# Patient Record
Sex: Female | Born: 1980 | Race: Black or African American | Hispanic: No | Smoking: Never smoker
Health system: Southern US, Community
[De-identification: ages and names within clinical notes are randomized; demographics above are authoritative.]

## PROBLEM LIST (undated history)

## (undated) ENCOUNTER — Inpatient Hospital Stay (HOSPITAL_COMMUNITY): Payer: Self-pay

## (undated) DIAGNOSIS — I10 Essential (primary) hypertension: Secondary | ICD-10-CM

## (undated) DIAGNOSIS — Z789 Other specified health status: Secondary | ICD-10-CM

## (undated) HISTORY — PX: NO PAST SURGERIES: SHX2092

## (undated) HISTORY — DX: Essential (primary) hypertension: I10

## (undated) HISTORY — DX: Other specified health status: Z78.9

---

## 2006-08-14 ENCOUNTER — Emergency Department (HOSPITAL_COMMUNITY): Admission: EM | Admit: 2006-08-14 | Discharge: 2006-08-14 | Payer: Self-pay | Admitting: Family Medicine

## 2006-08-19 ENCOUNTER — Encounter: Admission: RE | Admit: 2006-08-19 | Discharge: 2006-08-19 | Payer: Self-pay | Admitting: Family Medicine

## 2007-04-21 ENCOUNTER — Emergency Department (HOSPITAL_COMMUNITY): Admission: EM | Admit: 2007-04-21 | Discharge: 2007-04-21 | Payer: Self-pay | Admitting: Family Medicine

## 2007-05-15 ENCOUNTER — Emergency Department (HOSPITAL_COMMUNITY): Admission: EM | Admit: 2007-05-15 | Discharge: 2007-05-15 | Payer: Self-pay | Admitting: Emergency Medicine

## 2008-04-12 ENCOUNTER — Emergency Department (HOSPITAL_COMMUNITY): Admission: EM | Admit: 2008-04-12 | Discharge: 2008-04-12 | Payer: Self-pay | Admitting: Emergency Medicine

## 2010-05-22 LAB — POCT PREGNANCY, URINE: Preg Test, Ur: NEGATIVE

## 2010-05-22 LAB — POCT I-STAT, CHEM 8
BUN: 12 mg/dL (ref 6–23)
Chloride: 104 mEq/L (ref 96–112)
Creatinine, Ser: 0.9 mg/dL (ref 0.4–1.2)
Potassium: 3.9 mEq/L (ref 3.5–5.1)
Sodium: 140 mEq/L (ref 135–145)
TCO2: 30 mmol/L (ref 0–100)

## 2010-05-22 LAB — URINALYSIS, ROUTINE W REFLEX MICROSCOPIC
Glucose, UA: NEGATIVE mg/dL
Hgb urine dipstick: NEGATIVE
Nitrite: NEGATIVE
Specific Gravity, Urine: 1.022 (ref 1.005–1.030)
pH: 7 (ref 5.0–8.0)

## 2010-05-22 LAB — DIFFERENTIAL
Eosinophils Relative: 1 % (ref 0–5)
Lymphocytes Relative: 36 % (ref 12–46)
Lymphs Abs: 2.5 10*3/uL (ref 0.7–4.0)
Neutro Abs: 3.8 10*3/uL (ref 1.7–7.7)

## 2010-05-22 LAB — CBC
HCT: 38.6 % (ref 36.0–46.0)
Platelets: 203 10*3/uL (ref 150–400)
WBC: 7 10*3/uL (ref 4.0–10.5)

## 2010-11-04 LAB — URINALYSIS, ROUTINE W REFLEX MICROSCOPIC
Bilirubin Urine: NEGATIVE
Hgb urine dipstick: NEGATIVE
Ketones, ur: NEGATIVE
Protein, ur: NEGATIVE
Urobilinogen, UA: 1

## 2010-11-04 LAB — WET PREP, GENITAL: Yeast Wet Prep HPF POC: NONE SEEN

## 2010-11-04 LAB — GC/CHLAMYDIA PROBE AMP, GENITAL
Chlamydia, DNA Probe: NEGATIVE
GC Probe Amp, Genital: NEGATIVE

## 2011-02-10 NOTE — L&D Delivery Note (Signed)
Delivery Note At 3:27 PM a viable female was delivered via Vaginal, Spontaneous Delivery (Presentation: Middle Occiput Anterior).     Placenta status: delivered with cord traction, intact.  Cord: 3 vessels with the following complications: None.    Anesthesia:  None Episiotomy: None Lacerations: None Suture Repair: n/a Est. Blood Loss (mL): 300 ml  Mom to postpartum.  Baby to nursery-stable.  JACKSON-MOORE,Charlann Wayne A 09/27/2011, 3:50 PM

## 2011-04-28 LAB — OB RESULTS CONSOLE RPR: RPR: NONREACTIVE

## 2011-04-28 LAB — OB RESULTS CONSOLE GC/CHLAMYDIA: Gonorrhea: NEGATIVE

## 2011-04-28 LAB — OB RESULTS CONSOLE ABO/RH

## 2011-04-28 LAB — OB RESULTS CONSOLE HIV ANTIBODY (ROUTINE TESTING): HIV: NONREACTIVE

## 2011-09-27 ENCOUNTER — Encounter (HOSPITAL_COMMUNITY): Payer: Self-pay | Admitting: *Deleted

## 2011-09-27 ENCOUNTER — Inpatient Hospital Stay (HOSPITAL_COMMUNITY)
Admission: AD | Admit: 2011-09-27 | Discharge: 2011-09-29 | DRG: 775 | Disposition: A | Discharge status: Home / Self Care | Payer: Medicaid Other | Source: Ambulatory Visit | Attending: Obstetrics & Gynecology | Admitting: Obstetrics & Gynecology

## 2011-09-27 DIAGNOSIS — O4290 Premature rupture of membranes, unspecified as to length of time between rupture and onset of labor, unspecified weeks of gestation: Principal | ICD-10-CM | POA: Diagnosis present

## 2011-09-27 HISTORY — DX: Other specified health status: Z78.9

## 2011-09-27 LAB — CBC
MCH: 28.3 pg (ref 26.0–34.0)
MCHC: 33.1 g/dL (ref 30.0–36.0)
MCV: 85.4 fL (ref 78.0–100.0)
Platelets: 209 10*3/uL (ref 150–400)
RBC: 4.24 MIL/uL (ref 3.87–5.11)

## 2011-09-27 LAB — ABO/RH: ABO/RH(D): A POS

## 2011-09-27 LAB — TYPE AND SCREEN

## 2011-09-27 MED ORDER — TERBUTALINE SULFATE 1 MG/ML IJ SOLN: 0.2500 mg | Freq: Once | INTRAMUSCULAR | Status: DC | PRN

## 2011-09-27 MED ORDER — DIBUCAINE 1 % RE OINT: 1.0000 "application " | TOPICAL_OINTMENT | RECTAL | Status: DC | PRN

## 2011-09-27 MED ORDER — BUTORPHANOL TARTRATE 2 MG/ML IJ SOLN
2.0000 mg | INTRAMUSCULAR | Status: DC | PRN
  Administered 2011-09-27: 2 mg via INTRAVENOUS

## 2011-09-27 MED ORDER — CITRIC ACID-SODIUM CITRATE 334-500 MG/5ML PO SOLN: 30.0000 mL | ORAL | Status: DC | PRN

## 2011-09-27 MED ORDER — OXYTOCIN BOLUS FROM INFUSION
250.0000 mL | Freq: Once | INTRAVENOUS | Status: DC
  Filled 2011-09-27: qty 500

## 2011-09-27 MED ORDER — ZOLPIDEM TARTRATE 5 MG PO TABS: 5.0000 mg | ORAL_TABLET | Freq: Every evening | ORAL | Status: DC | PRN

## 2011-09-27 MED ORDER — ONDANSETRON HCL 4 MG PO TABS: 4.0000 mg | ORAL_TABLET | ORAL | Status: DC | PRN

## 2011-09-27 MED ORDER — OXYTOCIN 40 UNITS IN LACTATED RINGERS INFUSION - SIMPLE MED: 62.5000 mL/h | Freq: Once | INTRAVENOUS | Status: DC

## 2011-09-27 MED ORDER — WITCH HAZEL-GLYCERIN EX PADS: 1.0000 "application " | MEDICATED_PAD | CUTANEOUS | Status: DC | PRN

## 2011-09-27 MED ORDER — LACTATED RINGERS IV SOLN
INTRAVENOUS | Status: DC
  Administered 2011-09-27: 125 mL/h via INTRAVENOUS

## 2011-09-27 MED ORDER — LIDOCAINE HCL (PF) 1 % IJ SOLN
30.0000 mL | INTRAMUSCULAR | Status: DC | PRN
  Filled 2011-09-27 (×2): qty 30

## 2011-09-27 MED ORDER — PENICILLIN G POTASSIUM 5000000 UNITS IJ SOLR
5.0000 10*6.[IU] | Freq: Once | INTRAMUSCULAR | Status: AC
  Administered 2011-09-27: 5 10*6.[IU] via INTRAVENOUS
  Filled 2011-09-27: qty 5

## 2011-09-27 MED ORDER — ACETAMINOPHEN 325 MG PO TABS: 650.0000 mg | ORAL_TABLET | ORAL | Status: DC | PRN

## 2011-09-27 MED ORDER — IBUPROFEN 600 MG PO TABS
600.0000 mg | ORAL_TABLET | Freq: Four times a day (QID) | ORAL | Status: DC
  Administered 2011-09-27 – 2011-09-29 (×7): 600 mg via ORAL
  Filled 2011-09-27 (×7): qty 1

## 2011-09-27 MED ORDER — MEDROXYPROGESTERONE ACETATE 150 MG/ML IM SUSP: 150.0000 mg | INTRAMUSCULAR | Status: DC | PRN

## 2011-09-27 MED ORDER — DIPHENHYDRAMINE HCL 25 MG PO CAPS: 25.0000 mg | ORAL_CAPSULE | Freq: Four times a day (QID) | ORAL | Status: DC | PRN

## 2011-09-27 MED ORDER — ONDANSETRON HCL 4 MG/2ML IJ SOLN: 4.0000 mg | Freq: Four times a day (QID) | INTRAMUSCULAR | Status: DC | PRN

## 2011-09-27 MED ORDER — PENICILLIN G POTASSIUM 5000000 UNITS IJ SOLR
2.5000 10*6.[IU] | INTRAVENOUS | Status: DC
  Administered 2011-09-27: 2.5 10*6.[IU] via INTRAVENOUS
  Filled 2011-09-27 (×5): qty 2.5

## 2011-09-27 MED ORDER — OXYCODONE-ACETAMINOPHEN 5-325 MG PO TABS: 1.0000 | ORAL_TABLET | ORAL | Status: DC | PRN

## 2011-09-27 MED ORDER — LANOLIN HYDROUS EX OINT: TOPICAL_OINTMENT | CUTANEOUS | Status: DC | PRN

## 2011-09-27 MED ORDER — FERROUS SULFATE 325 (65 FE) MG PO TABS
325.0000 mg | ORAL_TABLET | Freq: Two times a day (BID) | ORAL | Status: DC
  Administered 2011-09-28 – 2011-09-29 (×3): 325 mg via ORAL
  Filled 2011-09-27 (×4): qty 1

## 2011-09-27 MED ORDER — BUTORPHANOL TARTRATE 1 MG/ML IJ SOLN
2.0000 mg | INTRAMUSCULAR | Status: DC
  Filled 2011-09-27: qty 2

## 2011-09-27 MED ORDER — SENNOSIDES-DOCUSATE SODIUM 8.6-50 MG PO TABS
2.0000 | ORAL_TABLET | Freq: Every day | ORAL | Status: DC
  Administered 2011-09-27 – 2011-09-28 (×2): 2 via ORAL

## 2011-09-27 MED ORDER — OXYTOCIN 40 UNITS IN LACTATED RINGERS INFUSION - SIMPLE MED
1.0000 m[IU]/min | INTRAVENOUS | Status: DC
  Administered 2011-09-27: 2 m[IU]/min via INTRAVENOUS
  Filled 2011-09-27: qty 1000

## 2011-09-27 MED ORDER — PRENATAL MULTIVITAMIN CH
1.0000 | ORAL_TABLET | Freq: Every day | ORAL | Status: DC
  Administered 2011-09-28 – 2011-09-29 (×2): 1 via ORAL
  Filled 2011-09-27 (×2): qty 1

## 2011-09-27 MED ORDER — TETANUS-DIPHTH-ACELL PERTUSSIS 5-2.5-18.5 LF-MCG/0.5 IM SUSP
0.5000 mL | Freq: Once | INTRAMUSCULAR | Status: AC
  Administered 2011-09-28: 0.5 mL via INTRAMUSCULAR
  Filled 2011-09-27: qty 0.5

## 2011-09-27 MED ORDER — BENZOCAINE-MENTHOL 20-0.5 % EX AERO: 1.0000 "application " | INHALATION_SPRAY | CUTANEOUS | Status: DC | PRN

## 2011-09-27 MED ORDER — MEASLES, MUMPS & RUBELLA VAC ~~LOC~~ INJ
0.5000 mL | INJECTION | Freq: Once | SUBCUTANEOUS | Status: DC
  Filled 2011-09-27: qty 0.5

## 2011-09-27 MED ORDER — IBUPROFEN 600 MG PO TABS: 600.0000 mg | ORAL_TABLET | Freq: Four times a day (QID) | ORAL | Status: DC | PRN

## 2011-09-27 MED ORDER — MAGNESIUM HYDROXIDE 400 MG/5ML PO SUSP: 30.0000 mL | ORAL | Status: DC | PRN

## 2011-09-27 MED ORDER — FLEET ENEMA 7-19 GM/118ML RE ENEM: 1.0000 | ENEMA | RECTAL | Status: DC | PRN

## 2011-09-27 MED ORDER — LACTATED RINGERS IV SOLN: 500.0000 mL | INTRAVENOUS | Status: DC | PRN

## 2011-09-27 MED ORDER — ONDANSETRON HCL 4 MG/2ML IJ SOLN: 4.0000 mg | INTRAMUSCULAR | Status: DC | PRN

## 2011-09-27 NOTE — MAU Note (Signed)
Pt reports having clear blood tinged fluid leak out. Having mild ctx as well.

## 2011-09-27 NOTE — Progress Notes (Signed)
Pt standing at bedside 

## 2011-09-27 NOTE — Progress Notes (Signed)
Up to bathroom

## 2011-09-27 NOTE — H&P (Signed)
Mystie Ormand is a 31 y.o. female presenting for rupture of membranes. Maternal Medical History:  Reason for admission: Reason for admission: rupture of membranes.  Fetal activity: Perceived fetal activity is normal.    Prenatal complications: no prenatal complications   OB History    Grav Para Term Preterm Abortions TAB SAB Ect Mult Living   3 2 2       2      Past Medical History  Diagnosis Date  . No pertinent past medical history    Past Surgical History  Procedure Date  . No past surgeries    Family History: family history is not on file. Social History:  reports that she quit smoking about 7 years ago. She does not have any smokeless tobacco history on file. She reports that she does not drink alcohol or use illicit drugs.     Review of Systems  Constitutional: Negative for fever.  Eyes: Negative for blurred vision.  Respiratory: Negative for shortness of breath.   Gastrointestinal: Negative for vomiting.  Skin: Negative for rash.  Neurological: Negative for headaches.    Dilation: 2.5 Effacement (%): 60 Station: -2 Exam by:: K.Sipos,RN Blood pressure 124/74, pulse 85, temperature 97.9 F (36.6 C), temperature source Oral, resp. rate 16, height 5\' 6"  (1.676 m), weight 105.325 kg (232 lb 3.2 oz). Maternal Exam:  Introitus: not evaluated.   Cervix: Cervix evaluated by digital exam.     Fetal Exam Fetal Monitor Review: Variability: moderate (6-25 bpm).   Pattern: accelerations present and no decelerations.    Fetal State Assessment: Category I - tracings are normal.     Physical Exam  Constitutional: She appears well-developed.  HENT:  Head: Normocephalic.  Neck: Neck supple. No thyromegaly present.  Cardiovascular: Normal rate and regular rhythm.   Respiratory: Breath sounds normal.  GI: Soft. Bowel sounds are normal.  Skin: No rash noted.    Prenatal labs: ABO, Rh: A/Positive/-- (03/19 0000) Antibody: Negative (03/19 0000) Rubella: Immune  (03/19 0000) RPR: Nonreactive (03/19 0000)  HBsAg: Negative (03/19 0000)  HIV: Non-reactive (03/19 0000)    Assessment/Plan: Multipara @ [redacted]w[redacted]d.  PPROM.  Admit PCN GBS prophylaxis IOL with low dose Pitocin per protocol   JACKSON-MOORE,Dystany Duffy A 09/27/2011, 10:29 AM

## 2011-09-28 NOTE — Progress Notes (Signed)
Ur chart review completed.  

## 2011-09-28 NOTE — Progress Notes (Signed)
Post Partum Day 1 Subjective: no complaints  Objective: Blood pressure 109/69, pulse 82, temperature 98.4 F (36.9 C), temperature source Oral, resp. rate 18, height 5\' 6"  (1.676 m), weight 105.325 kg (232 lb 3.2 oz), unknown if currently breastfeeding.  Physical Exam:  General: alert and no distress Lochia: appropriate Uterine Fundus: firm Incision: none DVT Evaluation: No evidence of DVT seen on physical exam.   Basename 09/27/11 0845  HGB 12.0  HCT 36.2    Assessment/Plan: Plan for discharge tomorrow   LOS: 1 day   HARPER,CHARLES A 09/28/2011, 9:15 AM

## 2011-09-29 MED ORDER — OXYCODONE-ACETAMINOPHEN 5-325 MG PO TABS: 1.0000 | ORAL_TABLET | ORAL | Status: AC | PRN

## 2011-09-29 MED ORDER — IBUPROFEN 600 MG PO TABS: 600.0000 mg | ORAL_TABLET | Freq: Four times a day (QID) | ORAL | Status: DC

## 2011-09-29 NOTE — Discharge Summary (Signed)
Obstetric Discharge Summary Reason for Admission: rupture of membranes Prenatal Procedures: ultrasound Intrapartum Procedures: spontaneous vaginal delivery Postpartum Procedures: none Complications-Operative and Postpartum: none Hemoglobin  Date Value Range Status  09/27/2011 12.0  12.0 - 15.0 g/dL Final     HCT  Date Value Range Status  09/27/2011 36.2  36.0 - 46.0 % Final    Physical Exam:  General: alert and no distress Lochia: appropriate Uterine Fundus: firm Incision: none DVT Evaluation: No evidence of DVT seen on physical exam.  Discharge Diagnoses: PPROM.  IOL.  Discharge Information: Date: 09/29/2011 Activity: pelvic rest Diet: routine Medications: PNV, Ibuprofen, Colace and Percocet Condition: stable Instructions: refer to practice specific booklet Discharge to: home Follow-up Information    Follow up with HARPER,CHARLES A, MD. Schedule an appointment as soon as possible for a visit in 6 weeks.   Contact information:   [address] [place] [address] [phone]          Newborn Data: Live born female  Birth Weight: 5 lb 15.1 oz (2696 g) APGAR: 8, 9  Home with mother.  HARPER,CHARLES A 09/29/2011, 8:45 AM

## 2011-09-29 NOTE — Progress Notes (Signed)
Post Partum Day 2 Subjective: no complaints  Objective: Blood pressure 113/73, pulse 84, temperature 98 F (36.7 C), temperature source Oral, resp. rate 18, height 5\' 6"  (1.676 m), weight 105.325 kg (232 lb 3.2 oz), SpO2 98.00%, unknown if currently breastfeeding.  Physical Exam:  General: alert and no distress Lochia: appropriate Uterine Fundus: firm Incision: none DVT Evaluation: No evidence of DVT seen on physical exam.   Basename 09/27/11 0845  HGB 12.0  HCT 36.2    Assessment/Plan: Discharge home   LOS: 2 days   Karizma Cheek A 09/29/2011, 8:41 AM

## 2011-12-23 ENCOUNTER — Ambulatory Visit (HOSPITAL_COMMUNITY): Admission: RE | Admit: 2011-12-23 | Payer: Medicaid Other | Source: Ambulatory Visit

## 2013-03-16 ENCOUNTER — Ambulatory Visit: Payer: Self-pay | Admitting: Obstetrics

## 2013-12-06 ENCOUNTER — Ambulatory Visit: Payer: Medicaid Other | Admitting: Obstetrics

## 2013-12-11 ENCOUNTER — Encounter (HOSPITAL_COMMUNITY): Payer: Self-pay | Admitting: *Deleted

## 2013-12-26 ENCOUNTER — Ambulatory Visit: Payer: Medicaid Other | Admitting: Obstetrics

## 2013-12-27 ENCOUNTER — Ambulatory Visit: Payer: Medicaid Other | Admitting: Obstetrics

## 2014-01-01 ENCOUNTER — Encounter: Payer: Self-pay | Admitting: Obstetrics

## 2014-01-01 ENCOUNTER — Ambulatory Visit (INDEPENDENT_AMBULATORY_CARE_PROVIDER_SITE_OTHER): Payer: BC Managed Care – PPO | Admitting: Obstetrics

## 2014-01-01 VITALS — BP 137/84 | HR 65 | Temp 97.7°F | Ht 65.0 in | Wt 178.0 lb

## 2014-01-01 DIAGNOSIS — Z01419 Encounter for gynecological examination (general) (routine) without abnormal findings: Secondary | ICD-10-CM

## 2014-01-01 NOTE — Progress Notes (Signed)
Subjective:     Carla Morrow is a 33 y.o. female here for a routine exam.  Current complaints: none.    Personal health questionnaire:  Is patient Ashkenazi Jewish, have a family history of breast and/or ovarian cancer: no Is there a family history of uterine cancer diagnosed at age < 4050, gastrointestinal cancer, urinary tract cancer, family member who is a Personnel officerLynch syndrome-associated carrier: no Is the patient overweight and hypertensive, family history of diabetes, personal history of gestational diabetes or PCOS: no Is patient over 3355, have PCOS,  family history of premature CHD under age 31, diabetes, smoke, have hypertension or peripheral artery disease:  no At any time, has a partner hit, kicked or otherwise hurt or frightened you?: no Over the past 2 weeks, have you felt down, depressed or hopeless?: no Over the past 2 weeks, have you felt little interest or pleasure in doing things?:no   Gynecologic History Patient's last menstrual period was 12/15/2013. Contraception: none Last Pap: 2013. Results were: normal Last mammogram: n/a. Results were: n/a  Obstetric History OB History  Gravida Para Term Preterm AB SAB TAB Ectopic Multiple Living  3 3 2 1      3     # Outcome Date GA Lbr Len/2nd Weight Sex Delivery Anes PTL Lv  3 Preterm 09/27/11 3141w6d 08:26 / 00:11 5 lb 15.1 oz (2.696 kg) M Vag-Spont None  Y     Comments: WNL  2 Term           1 Term               Past Medical History  Diagnosis Date  . No pertinent past medical history     Past Surgical History  Procedure Laterality Date  . No past surgeries      No current outpatient prescriptions on file. No Known Allergies  History  Substance Use Topics  . Smoking status: Never Smoker   . Smokeless tobacco: Never Used  . Alcohol Use: No    Family History  Problem Relation Age of Onset  . Anuerysm Mother       Review of Systems  Constitutional: negative for fatigue and weight loss Respiratory: negative  for cough and wheezing Cardiovascular: negative for chest pain, fatigue and palpitations Gastrointestinal: negative for abdominal pain and change in bowel habits Musculoskeletal:negative for myalgias Neurological: negative for gait problems and tremors Behavioral/Psych: negative for abusive relationship, depression Endocrine: negative for temperature intolerance   Genitourinary:negative for abnormal menstrual periods, genital lesions, hot flashes, sexual problems and vaginal discharge Integument/breast: negative for breast lump, breast tenderness, nipple discharge and skin lesion(s)    Objective:       BP 137/84 mmHg  Pulse 65  Temp(Src) 97.7 F (36.5 C)  Ht 5\' 5"  (1.651 m)  Wt 178 lb (80.74 kg)  BMI 29.62 kg/m2  LMP 12/15/2013 General:   alert  Skin:   no rash or abnormalities  Lungs:   clear to auscultation bilaterally  Heart:   regular rate and rhythm, S1, S2 normal, no murmur, click, rub or gallop  Breasts:   normal without suspicious masses, skin or nipple changes or axillary nodes  Abdomen:  normal findings: no organomegaly, soft, non-tender and no hernia  Pelvis:  External genitalia: normal general appearance Urinary system: urethral meatus normal and bladder without fullness, nontender Vaginal: normal without tenderness, induration or masses Cervix: normal appearance Adnexa: normal bimanual exam Uterus: anteverted and non-tender, normal size   Lab Review Urine pregnancy test Labs reviewed  yes Radiologic studies reviewed no    Assessment:    Healthy female exam.    Contraceptive Counseling.  Wants Mirena IUD   Plan:   Mirena IUD ordered.  To be inserted with next menses.   Education reviewed: calcium supplements, low fat, low cholesterol diet, safe sex/STD prevention, self breast exams and weight bearing exercise. Contraception: IUD. Follow up in: 1 year.   No orders of the defined types were placed in this encounter.   Orders Placed This Encounter   Procedures  . WET PREP BY MOLECULAR PROBE  . GC/Chlamydia Probe Amp

## 2014-01-02 LAB — WET PREP BY MOLECULAR PROBE
Candida species: NEGATIVE
Gardnerella vaginalis: NEGATIVE
TRICHOMONAS VAG: NEGATIVE

## 2014-01-02 LAB — GC/CHLAMYDIA PROBE AMP
CT Probe RNA: NEGATIVE
GC PROBE AMP APTIMA: NEGATIVE

## 2014-01-03 LAB — PAP IG AND HPV HIGH-RISK: HPV DNA HIGH RISK: NOT DETECTED

## 2014-01-19 ENCOUNTER — Telehealth: Payer: Self-pay | Admitting: *Deleted

## 2014-01-19 NOTE — Telephone Encounter (Signed)
Patient called regarding a Mirena IUD Insertion.  Attempted to contact the patient and left message for her to contact the office.

## 2014-01-24 NOTE — Telephone Encounter (Signed)
Patient will be in the office tomorrow for a consult for a colpo. Will schedule with patient before she leaves. Patient advised and verbalized understanding.

## 2014-01-25 ENCOUNTER — Ambulatory Visit (INDEPENDENT_AMBULATORY_CARE_PROVIDER_SITE_OTHER): Payer: BC Managed Care – PPO | Admitting: Obstetrics

## 2014-01-25 ENCOUNTER — Encounter: Payer: Self-pay | Admitting: Obstetrics

## 2014-01-25 VITALS — BP 152/88 | HR 56 | Temp 98.7°F | Ht 66.0 in | Wt 173.0 lb

## 2014-01-25 DIAGNOSIS — B373 Candidiasis of vulva and vagina: Secondary | ICD-10-CM

## 2014-01-25 DIAGNOSIS — IMO0002 Reserved for concepts with insufficient information to code with codable children: Secondary | ICD-10-CM

## 2014-01-25 DIAGNOSIS — B3731 Acute candidiasis of vulva and vagina: Secondary | ICD-10-CM

## 2014-01-25 DIAGNOSIS — R896 Abnormal cytological findings in specimens from other organs, systems and tissues: Secondary | ICD-10-CM

## 2014-01-25 MED ORDER — FLUCONAZOLE 150 MG PO TABS: 150.0000 mg | ORAL_TABLET | Freq: Once | ORAL | Status: DC

## 2014-01-25 NOTE — Progress Notes (Signed)
Patient presents to discuss pap results. Pap smear revealed ASCUS with negative High Risk HPV DNA.  Recommendation:  Repeat pap 1 year with co-testing.  Coral Ceoharles Harper MD

## 2014-06-13 ENCOUNTER — Telehealth: Payer: Self-pay | Admitting: *Deleted

## 2014-06-13 ENCOUNTER — Other Ambulatory Visit: Payer: Self-pay | Admitting: *Deleted

## 2014-06-13 DIAGNOSIS — B3731 Acute candidiasis of vulva and vagina: Secondary | ICD-10-CM

## 2014-06-13 DIAGNOSIS — B373 Candidiasis of vulva and vagina: Secondary | ICD-10-CM

## 2014-06-13 MED ORDER — FLUCONAZOLE 150 MG PO TABS: 150.0000 mg | ORAL_TABLET | Freq: Once | ORAL | Status: DC

## 2014-06-13 NOTE — Telephone Encounter (Signed)
Patient request Rf of Diflucan 1:50 Patient states she has started antibiotic therapy for acne and she has developed a yeast infection. She was using probiotics,but stopped. Advised patient she may need to continue- active cultures or probiotics- and let us know if she has the yeast as a chronic problem. Rx refilled

## 2014-06-14 ENCOUNTER — Ambulatory Visit: Payer: Self-pay | Admitting: Obstetrics

## 2014-09-10 ENCOUNTER — Telehealth: Payer: Self-pay | Admitting: *Deleted

## 2014-09-10 DIAGNOSIS — B373 Candidiasis of vulva and vagina: Secondary | ICD-10-CM

## 2014-09-10 DIAGNOSIS — B3731 Acute candidiasis of vulva and vagina: Secondary | ICD-10-CM

## 2014-09-10 MED ORDER — FLUCONAZOLE 150 MG PO TABS: 150.0000 mg | ORAL_TABLET | Freq: Once | ORAL | Status: DC

## 2014-09-10 NOTE — Telephone Encounter (Signed)
Patient is requesting Diflucan refill. Patient is on acne treatment and is requesting refill. Rx refilled for patient- no VM set up.

## 2014-10-04 ENCOUNTER — Telehealth: Payer: Self-pay | Admitting: Obstetrics

## 2014-10-05 ENCOUNTER — Ambulatory Visit (INDEPENDENT_AMBULATORY_CARE_PROVIDER_SITE_OTHER): Payer: BC Managed Care – PPO | Admitting: Obstetrics

## 2014-10-05 ENCOUNTER — Encounter: Payer: Self-pay | Admitting: Obstetrics

## 2014-10-05 VITALS — BP 155/112 | HR 69 | Temp 98.0°F | Ht 66.0 in | Wt 168.0 lb

## 2014-10-05 DIAGNOSIS — N760 Acute vaginitis: Secondary | ICD-10-CM | POA: Diagnosis not present

## 2014-10-05 DIAGNOSIS — A499 Bacterial infection, unspecified: Secondary | ICD-10-CM

## 2014-10-05 DIAGNOSIS — B3731 Acute candidiasis of vulva and vagina: Secondary | ICD-10-CM

## 2014-10-05 DIAGNOSIS — B9689 Other specified bacterial agents as the cause of diseases classified elsewhere: Secondary | ICD-10-CM

## 2014-10-05 DIAGNOSIS — I10 Essential (primary) hypertension: Secondary | ICD-10-CM | POA: Diagnosis not present

## 2014-10-05 DIAGNOSIS — B373 Candidiasis of vulva and vagina: Secondary | ICD-10-CM | POA: Diagnosis not present

## 2014-10-05 MED ORDER — FLUCONAZOLE 150 MG PO TABS: 150.0000 mg | ORAL_TABLET | Freq: Once | ORAL | Status: DC

## 2014-10-05 MED ORDER — METRONIDAZOLE 500 MG PO TABS: 500.0000 mg | ORAL_TABLET | Freq: Two times a day (BID) | ORAL | Status: DC

## 2014-10-05 MED ORDER — CARVEDILOL 12.5 MG PO TABS: 12.5000 mg | ORAL_TABLET | Freq: Two times a day (BID) | ORAL | Status: DC

## 2014-10-05 MED ORDER — TRIAMTERENE-HCTZ 50-25 MG PO CAPS: 1.0000 | ORAL_CAPSULE | ORAL | Status: DC

## 2014-10-05 NOTE — Telephone Encounter (Signed)
[id number] - patient appt scheduled °

## 2014-10-05 NOTE — Patient Instructions (Signed)
Bacterial Vaginosis Bacterial vaginosis is a vaginal infection that occurs when the normal balance of bacteria in the vagina is disrupted. It results from an overgrowth of certain bacteria. This is the most common vaginal infection in women of childbearing age. Treatment is important to prevent complications, especially in pregnant women, as it can cause a premature delivery. CAUSES  Bacterial vaginosis is caused by an increase in harmful bacteria that are normally present in smaller amounts in the vagina. Several different kinds of bacteria can cause bacterial vaginosis. However, the reason that the condition develops is not fully understood. RISK FACTORS Certain activities or behaviors can put you at an increased risk of developing bacterial vaginosis, including:  Having a new sex partner or multiple sex partners.  Douching.  Using an intrauterine device (IUD) for contraception. Women do not get bacterial vaginosis from toilet seats, bedding, swimming pools, or contact with objects around them. SIGNS AND SYMPTOMS  Some women with bacterial vaginosis have no signs or symptoms. Common symptoms include:  Grey vaginal discharge.  A fishlike odor with discharge, especially after sexual intercourse.  Itching or burning of the vagina and vulva.  Burning or pain with urination. DIAGNOSIS  Your health care provider will take a medical history and examine the vagina for signs of bacterial vaginosis. A sample of vaginal fluid may be taken. Your health care provider will look at this sample under a microscope to check for bacteria and abnormal cells. A vaginal pH test may also be done.  TREATMENT  Bacterial vaginosis may be treated with antibiotic medicines. These may be given in the form of a pill or a vaginal cream. A second round of antibiotics may be prescribed if the condition comes back after treatment.  HOME CARE INSTRUCTIONS   Only take over-the-counter or prescription medicines as  directed by your health care provider.  If antibiotic medicine was prescribed, take it as directed. Make sure you finish it even if you start to feel better.  Do not have sex until treatment is completed.  Tell all sexual partners that you have a vaginal infection. They should see their health care provider and be treated if they have problems, such as a mild rash or itching.  Practice safe sex by using condoms and only having one sex partner. SEEK MEDICAL CARE IF:   Your symptoms are not improving after 3 days of treatment.  You have increased discharge or pain.  You have a fever. MAKE SURE YOU:   Understand these instructions.  Will watch your condition.  Will get help right away if you are not doing well or get worse. FOR MORE INFORMATION  Centers for Disease Control and Prevention, Division of STD Prevention: www.cdc.gov/std American Sexual Health Association (ASHA): www.ashastd.org  Document Released: 01/26/2005 Document Revised: 11/16/2012 Document Reviewed: 09/07/2012 ExitCare Patient Information 2015 [employer]. This information is not intended to replace advice given to you by your health care provider. Make sure you discuss any questions you have with your health care provider.  

## 2014-10-05 NOTE — Progress Notes (Signed)
Patient ID: Carla Morrow, female   DOB: [birth year], 34 y.o.   MRN: [id number]  Chief Complaint  Patient presents with  . Vaginitis    HPI Carla Morrow is a 34 y.o. female.  Malodorous vaginal discharge.  HPI  Past Medical History  Diagnosis Date  . No pertinent past medical history     Past Surgical History  Procedure Laterality Date  . No past surgeries      Family History  Problem Relation Age of Onset  . Anuerysm Mother     Social History Social History  Substance Use Topics  . Smoking status: Never Smoker   . Smokeless tobacco: Never Used  . Alcohol Use: No    No Known Allergies  Current Outpatient Prescriptions  Medication Sig Dispense Refill  . carvedilol (COREG) 12.5 MG tablet Take 1 tablet (12.5 mg total) by mouth 2 (two) times daily with a meal. 60 tablet 11  . fluconazole (DIFLUCAN) 150 MG tablet Take 1 tablet (150 mg total) by mouth once. 1 tablet 2  . metroNIDAZOLE (FLAGYL) 500 MG tablet Take 1 tablet (500 mg total) by mouth 2 (two) times daily. 14 tablet 2  . triamterene-hydrochlorothiazide (DYAZIDE) 50-25 MG per capsule Take 1 capsule by mouth every morning. 30 capsule 11   No current facility-administered medications for this visit.    Review of Systems Review of Systems Constitutional: negative for fatigue and weight loss Respiratory: negative for cough and wheezing Cardiovascular: negative for chest pain, fatigue and palpitations Gastrointestinal: negative for abdominal pain and change in bowel habits Genitourinary:negative Integument/breast: negative for nipple discharge Musculoskeletal:negative for myalgias Neurological: negative for gait problems and tremors Behavioral/Psych: negative for abusive relationship, depression Endocrine: negative for temperature intolerance     Blood pressure 155/112, pulse 69, temperature 98 F (36.7 C), height 5\' 6"  (1.676 m), weight 168 lb (76.204 kg), last menstrual period 09/21/2014, unknown if  currently breastfeeding.  Physical Exam Physical Exam            General:  Alert and no distress Abdomen:  normal findings: no organomegaly, soft, non-tender and no hernia  Pelvis:  External genitalia: normal general appearance Urinary system: urethral meatus normal and bladder without fullness, nontender Vaginal: normal without tenderness, induration or masses Cervix: normal appearance Adnexa: normal bimanual exam Uterus: anteverted and non-tender, normal size       Data Reviewed Labs  Assessment     BV  Hypertension    Plan    Flagyl Rx Referred to Internal Medicine for F/U of HTN and routine health care F/U 2 months for annual and pap  Orders Placed This Encounter  Procedures  . SureSwab, Vaginosis/Vaginitis Plus  . Ambulatory referral to Internal Medicine    Referral Priority:  Routine    Referral Type:  Consultation    Referral Reason:  Specialty Services Required    Requested Specialty:  Internal Medicine    Number of Visits Requested:  1   Meds ordered this encounter  Medications  . metroNIDAZOLE (FLAGYL) 500 MG tablet    Sig: Take 1 tablet (500 mg total) by mouth 2 (two) times daily.    Dispense:  14 tablet    Refill:  2  . fluconazole (DIFLUCAN) 150 MG tablet    Sig: Take 1 tablet (150 mg total) by mouth once.    Dispense:  1 tablet    Refill:  2  . triamterene-hydrochlorothiazide (DYAZIDE) 50-25 MG per capsule    Sig: Take 1 capsule by mouth every morning.  Dispense:  30 capsule    Refill:  11  . carvedilol (COREG) 12.5 MG tablet    Sig: Take 1 tablet (12.5 mg total) by mouth 2 (two) times daily with a meal.    Dispense:  60 tablet    Refill:  11

## 2014-10-10 LAB — SURESWAB, VAGINOSIS/VAGINITIS PLUS
Atopobium vaginae: 7.7 Log (cells/mL)
C. GLABRATA, DNA: NOT DETECTED
C. PARAPSILOSIS, DNA: NOT DETECTED
C. TROPICALIS, DNA: NOT DETECTED
C. albicans, DNA: NOT DETECTED
C. trachomatis RNA, TMA: NOT DETECTED
GARDNERELLA VAGINALIS: 7.8 Log (cells/mL)
LACTOBACILLUS SPECIES: NOT DETECTED Log (cells/mL)
MEGASPHAERA SPECIES: 7.9 Log (cells/mL)
N. gonorrhoeae RNA, TMA: NOT DETECTED
T. VAGINALIS RNA, QL TMA: NOT DETECTED

## 2014-10-11 ENCOUNTER — Other Ambulatory Visit: Payer: Self-pay | Admitting: Obstetrics

## 2014-10-11 DEATH — deceased

## 2015-01-31 ENCOUNTER — Ambulatory Visit: Payer: BC Managed Care – PPO | Admitting: Obstetrics

## 2015-02-05 ENCOUNTER — Encounter: Payer: BC Managed Care – PPO | Admitting: Obstetrics

## 2015-02-05 ENCOUNTER — Encounter: Payer: Self-pay | Admitting: Obstetrics

## 2015-02-08 ENCOUNTER — Encounter: Payer: Self-pay | Admitting: Certified Nurse Midwife

## 2015-02-08 ENCOUNTER — Ambulatory Visit (INDEPENDENT_AMBULATORY_CARE_PROVIDER_SITE_OTHER): Payer: BC Managed Care – PPO | Admitting: Certified Nurse Midwife

## 2015-02-08 VITALS — BP 130/85 | HR 78 | Temp 97.6°F | Wt 197.0 lb

## 2015-02-08 DIAGNOSIS — Z8679 Personal history of other diseases of the circulatory system: Secondary | ICD-10-CM | POA: Diagnosis not present

## 2015-02-08 DIAGNOSIS — O09892 Supervision of other high risk pregnancies, second trimester: Secondary | ICD-10-CM

## 2015-02-08 DIAGNOSIS — O162 Unspecified maternal hypertension, second trimester: Secondary | ICD-10-CM | POA: Diagnosis not present

## 2015-02-08 DIAGNOSIS — O09212 Supervision of pregnancy with history of pre-term labor, second trimester: Secondary | ICD-10-CM | POA: Diagnosis not present

## 2015-02-08 DIAGNOSIS — Z3482 Encounter for supervision of other normal pregnancy, second trimester: Secondary | ICD-10-CM | POA: Diagnosis not present

## 2015-02-08 DIAGNOSIS — O09219 Supervision of pregnancy with history of pre-term labor, unspecified trimester: Secondary | ICD-10-CM | POA: Diagnosis not present

## 2015-02-08 DIAGNOSIS — Z8751 Personal history of pre-term labor: Secondary | ICD-10-CM | POA: Insufficient documentation

## 2015-02-08 LAB — POCT URINALYSIS DIPSTICK
Bilirubin, UA: NEGATIVE
Glucose, UA: NEGATIVE
KETONES UA: NEGATIVE
Leukocytes, UA: NEGATIVE
Nitrite, UA: NEGATIVE
PH UA: 7
PROTEIN UA: NEGATIVE
RBC UA: NEGATIVE
SPEC GRAV UA: 1.015
Urobilinogen, UA: NEGATIVE

## 2015-02-08 MED ORDER — CITRANATAL HARMONY 30-1-260 MG PO CAPS: 1.0000 | ORAL_CAPSULE | Freq: Every day | ORAL | Status: DC

## 2015-02-08 NOTE — Progress Notes (Signed)
Subjective:    Carla Morrow is being seen today for her first obstetrical visit.  This is not a planned pregnancy. She is at 2414w6d gestation. Her obstetrical history is significant for none. Relationship with FOB: significant other, living together. Patient does intend to breast feed. Pregnancy history fully reviewed.  Last pregnancy was delivered at 7429w6d, did not require NICU admission, SROM.    The information documented in the HPI was reviewed and verified.  Menstrual History: OB History    Gravida Para Term Preterm AB TAB SAB Ectopic Multiple Living   4 3 2 1  0 0 0 0 0 3      Menarche age: 34 years of age.   Patient's last menstrual period was 10/20/2014.    Past Medical History  Diagnosis Date  . No pertinent past medical history   . Medical history non-contributory     Past Surgical History  Procedure Laterality Date  . No past surgeries       (Not in a hospital admission) No Known Allergies  Social History  Substance Use Topics  . Smoking status: Never Smoker   . Smokeless tobacco: Never Used  . Alcohol Use: No    Family History  Problem Relation Age of Onset  . Anuerysm Mother   . Hypertension Father      Review of Systems Constitutional: negative for weight loss Gastrointestinal: negative for vomiting Genitourinary:negative for genital lesions and vaginal discharge and dysuria Musculoskeletal:negative for back pain Behavioral/Psych: negative for abusive relationship, depression, illegal drug usage and tobacco use    Objective:    BP 130/85 mmHg  Pulse 78  Temp(Src) 97.6 F (36.4 C)  Wt 197 lb (89.359 kg)  LMP 10/20/2014 General Appearance:    Alert, cooperative, no distress, appears stated age  Head:    Normocephalic, without obvious abnormality, atraumatic  Eyes:    PERRL, conjunctiva/corneas clear, EOM's intact, fundi    benign, both eyes  Ears:    Normal TM's and external ear canals, both ears  Nose:   Nares normal, septum midline, mucosa  normal, no drainage    or sinus tenderness  Throat:   Lips, mucosa, and tongue normal; teeth and gums normal  Neck:   Supple, symmetrical, trachea midline, no adenopathy;    thyroid:  no enlargement/tenderness/nodules; no carotid   bruit or JVD  Back:     Symmetric, no curvature, ROM normal, no CVA tenderness  Lungs:     Clear to auscultation bilaterally, respirations unlabored  Chest Wall:    No tenderness or deformity   Heart:    Regular rate and rhythm, S1 and S2 normal, no murmur, rub   or gallop  Breast Exam:    No tenderness, masses, or nipple abnormality  Abdomen:     Soft, non-tender, bowel sounds active all four quadrants,    no masses, no organomegaly  Genitalia:    Normal female without lesion, discharge or tenderness  Extremities:   Extremities normal, atraumatic, no cyanosis or edema  Pulses:   2+ and symmetric all extremities  Skin:   Skin color, texture, turgor normal, no rashes or lesions  Lymph nodes:   Cervical, supraclavicular, and axillary nodes normal  Neurologic:   CNII-XII intact, normal strength, sensation and reflexes    throughout           Cervix:  Long, thick, closed and posterior  FHR:  160's by doppler.  FH: slightly less than U.     Lab Review Urine pregnancy test Labs reviewed yes Radiologic studies reviewed no Assessment:    Pregnancy at [redacted]w[redacted]d weeks   Doing well.  Hx of PPROM at   Hx of HTN, not currently on medications  Plan:      Prenatal vitamins.  Counseling provided regarding continued use of seat belts, cessation of alcohol consumption, smoking or use of illicit drugs; infection precautions i.e., influenza/TDAP immunizations, toxoplasmosis,CMV, parvovirus, listeria and varicella; workplace safety, exercise during pregnancy; routine dental care, safe medications, sexual activity, hot tubs, saunas, pools, travel, caffeine use, fish and methlymercury, potential toxins, hair treatments, varicose  veins Weight gain recommendations per IOM guidelines reviewed: underweight/BMI< 18.5--> gain 28 - 40 lbs; normal weight/BMI 18.5 - 24.9--> gain 25 - 35 lbs; overweight/BMI 25 - 29.9--> gain 15 - 25 lbs; obese/BMI >30->gain  11 - 20 lbs Problem list reviewed and updated. FIRST/CF mutation testing/NIPT/QUAD SCREEN/fragile X/Ashkenazi Jewish population testing/Spinal muscular atrophy discussed: ordered. Role of ultrasound in pregnancy discussed; fetal survey: ordered. Amniocentesis discussed: not indicated. VBAC calculator score: VBAC consent form provided Meds ordered this encounter  Medications  . Prenat w/o [id number] (CITRANATAL HARMONY) 30-1-260 MG CAPS    Sig: Take 1 tablet by mouth daily.    Dispense:  30 capsule    Refill:  12   Orders Placed This Encounter  Procedures  . Culture, OB Urine  . SureSwab, Vaginosis/Vaginitis Plus  . [place] MFM OB COMP + 14 WK    Standing Status: Future     Number of Occurrences:      Standing Expiration Date: 04/08/2016    Order Specific Question:  Reason for Exam (SYMPTOM  OR DIAGNOSIS REQUIRED)    Answer:  fetal anatomy scan, dating    Order Specific Question:  Preferred imaging location?    Answer:  [facility]Ul[facility]  . Obstetric panel  . HIV antibody  . Hemoglobinopathy evaluation  . Varicella zoster antibody, IgG  . VITAMIN D 25 Hydroxy (Vit-D Deficiency, Fractures)  . TSH  . AFP, Quad Screen    Order Specific Question:  Repeat Sample    Answer:  No    Order Specific Question:  Maternal Race    Answer:  black    Order Specific Question:  EDD    Answer:  07/26/2015    Order Specific Question:  Pregnancy Donor Egg (Y/N)    Answer:  No    Order Specific Question:  Gest Age at U/S (Wk.Dy)    Answer:  none     Comments:  none    Order Specific Question:  LMP:    Answer:  10/20/2014    Order Specific Question:  Number of Fetuses    Answer:  1    Order Specific Question:  Hx of OSB/NTD?    Answer:  No    Order Specific Question:   History of Down Syndrome?    Answer:  No    Order Specific Question:  Maternal IDDM (insulin-dependent diabetes mellitus)    Answer:  No    Order Specific Question:  Maternal Weight (lbs)    Answer:  197  . AMB referral to maternal fetal medicine    Referral Priority:  Routine    Referral Type:  Consultation    Referral Reason:  Specialty Services Required    Number of Visits Requested:  1  . POCT urine pregnancy  . POCT urinalysis dipstick    Follow up in  4 weeks. 50% of 30 min visit spent on counseling and coordination of care.

## 2015-02-09 LAB — VITAMIN D 25 HYDROXY (VIT D DEFICIENCY, FRACTURES): VIT D 25 HYDROXY: 23 ng/mL — AB (ref 30–100)

## 2015-02-09 LAB — TSH: TSH: 1.524 u[IU]/mL (ref 0.350–4.500)

## 2015-02-09 LAB — HIV ANTIBODY (ROUTINE TESTING W REFLEX): HIV 1&2 Ab, 4th Generation: NONREACTIVE

## 2015-02-10 LAB — CULTURE, OB URINE: Colony Count: 25000

## 2015-02-10 NOTE — L&D Delivery Note (Signed)
Delivery Note At 9:47 AM a viable female was delivered via Vaginal, Spontaneous Delivery (Presentation: ; Occiput Anterior).  APGAR: 9, 9; weight  .   Placenta status: Intact, Expressed.  Cord: 3 vessels with the following complications: None.  Cord pH: not done  Anesthesia: None  Episiotomy: None Lacerations: None Suture Repair:  Est. Blood Loss (mL): 250  Mom to postpartum.  Baby to Couplet care / Skin to Skin.  Alayja Armas A 07/07/2015, 10:24 AM

## 2015-02-12 LAB — AFP, QUAD SCREEN
AFP: 30 ng/mL
Curr Gest Age: 16 wks.days
Down Syndrome Scr Risk Est: 1:17400 {titer}
HCG TOTAL: 21.81 [IU]/mL
INH: 111.2 pg/mL
Interpretation-AFP: NEGATIVE
MOM FOR AFP: 0.93
MOM FOR HCG: 0.6
MoM for INH: 0.72
OPEN SPINA BIFIDA: NEGATIVE
Osb Risk: 1:33800 {titer}
Tri 18 Scr Risk Est: NEGATIVE
Trisomy 18 (Edward) Syndrome Interp.: 1:19200 {titer}
uE3 Mom: 1.19
uE3 Value: 0.93 ng/mL

## 2015-02-12 LAB — OBSTETRIC PANEL
ANTIBODY SCREEN: NEGATIVE
BASOS ABS: 0 10*3/uL (ref 0.0–0.1)
BASOS PCT: 0 % (ref 0–1)
EOS ABS: 0 10*3/uL (ref 0.0–0.7)
EOS PCT: 0 % (ref 0–5)
HEMATOCRIT: 38 % (ref 36.0–46.0)
HEMOGLOBIN: 12.6 g/dL (ref 12.0–15.0)
Hepatitis B Surface Ag: NEGATIVE
Lymphocytes Relative: 20 % (ref 12–46)
Lymphs Abs: 1.9 10*3/uL (ref 0.7–4.0)
MCH: 28.1 pg (ref 26.0–34.0)
MCHC: 33.2 g/dL (ref 30.0–36.0)
MCV: 84.8 fL (ref 78.0–100.0)
MONO ABS: 0.7 10*3/uL (ref 0.1–1.0)
MPV: 10.2 fL (ref 8.6–12.4)
Monocytes Relative: 7 % (ref 3–12)
Neutro Abs: 6.9 10*3/uL (ref 1.7–7.7)
Neutrophils Relative %: 73 % (ref 43–77)
PLATELETS: 228 10*3/uL (ref 150–400)
RBC: 4.48 MIL/uL (ref 3.87–5.11)
RDW: 13.5 % (ref 11.5–15.5)
RH TYPE: POSITIVE
Rubella: 2.18 Index — ABNORMAL HIGH (ref <0.90)
WBC: 9.5 10*3/uL (ref 4.0–10.5)

## 2015-02-12 LAB — VARICELLA ZOSTER ANTIBODY, IGG: VARICELLA IGG: 624 {index} — AB (ref <135.00)

## 2015-02-13 LAB — HEMOGLOBINOPATHY EVALUATION
HEMOGLOBIN OTHER: 0 %
HGB A2 QUANT: 2.4 % (ref 2.2–3.2)
HGB F QUANT: 0 % (ref 0.0–2.0)
HGB S QUANTITAION: 0 %
Hgb A: 97.6 % (ref 96.8–97.8)

## 2015-02-14 LAB — SURESWAB, VAGINOSIS/VAGINITIS PLUS
ATOPOBIUM VAGINAE: NOT DETECTED Log (cells/mL)
C. ALBICANS, DNA: NOT DETECTED
C. PARAPSILOSIS, DNA: NOT DETECTED
C. TROPICALIS, DNA: NOT DETECTED
C. glabrata, DNA: NOT DETECTED
C. trachomatis RNA, TMA: NOT DETECTED
LACTOBACILLUS SPECIES: 7.9 Log (cells/mL)
MEGASPHAERA SPECIES: NOT DETECTED Log (cells/mL)
N. gonorrhoeae RNA, TMA: NOT DETECTED
T. VAGINALIS RNA, QL TMA: NOT DETECTED

## 2015-02-14 LAB — PAP, TP IMAGING W/ HPV RNA, RFLX HPV TYPE 16,18/45: HPV MRNA, HIGH RISK: NOT DETECTED

## 2015-02-27 ENCOUNTER — Other Ambulatory Visit: Payer: Self-pay | Admitting: Certified Nurse Midwife

## 2015-03-01 ENCOUNTER — Ambulatory Visit (HOSPITAL_COMMUNITY)
Admission: RE | Admit: 2015-03-01 | Discharge: 2015-03-01 | Disposition: A | Discharge status: Home / Self Care | Payer: Medicaid Other | Source: Ambulatory Visit | Attending: Certified Nurse Midwife | Admitting: Certified Nurse Midwife

## 2015-03-01 ENCOUNTER — Other Ambulatory Visit: Payer: Self-pay | Admitting: Certified Nurse Midwife

## 2015-03-01 ENCOUNTER — Other Ambulatory Visit (HOSPITAL_COMMUNITY): Payer: Self-pay | Admitting: Obstetrics and Gynecology

## 2015-03-01 ENCOUNTER — Encounter (HOSPITAL_COMMUNITY): Payer: Self-pay

## 2015-03-01 VITALS — BP 127/84 | HR 69 | Wt 203.4 lb

## 2015-03-01 DIAGNOSIS — O162 Unspecified maternal hypertension, second trimester: Secondary | ICD-10-CM

## 2015-03-01 DIAGNOSIS — O0932 Supervision of pregnancy with insufficient antenatal care, second trimester: Secondary | ICD-10-CM | POA: Insufficient documentation

## 2015-03-01 DIAGNOSIS — O43122 Velamentous insertion of umbilical cord, second trimester: Secondary | ICD-10-CM

## 2015-03-01 DIAGNOSIS — O09212 Supervision of pregnancy with history of pre-term labor, second trimester: Secondary | ICD-10-CM | POA: Insufficient documentation

## 2015-03-01 DIAGNOSIS — Z36 Encounter for antenatal screening of mother: Secondary | ICD-10-CM | POA: Diagnosis not present

## 2015-03-01 DIAGNOSIS — Z8751 Personal history of pre-term labor: Secondary | ICD-10-CM

## 2015-03-01 DIAGNOSIS — O10019 Pre-existing essential hypertension complicating pregnancy, unspecified trimester: Secondary | ICD-10-CM

## 2015-03-01 DIAGNOSIS — O10012 Pre-existing essential hypertension complicating pregnancy, second trimester: Secondary | ICD-10-CM | POA: Insufficient documentation

## 2015-03-01 DIAGNOSIS — Z3689 Encounter for other specified antenatal screening: Secondary | ICD-10-CM

## 2015-03-01 DIAGNOSIS — O09892 Supervision of other high risk pregnancies, second trimester: Secondary | ICD-10-CM

## 2015-03-01 DIAGNOSIS — Z3A18 18 weeks gestation of pregnancy: Secondary | ICD-10-CM | POA: Diagnosis not present

## 2015-03-01 DIAGNOSIS — O10912 Unspecified pre-existing hypertension complicating pregnancy, second trimester: Secondary | ICD-10-CM

## 2015-03-01 DIAGNOSIS — O09292 Supervision of pregnancy with other poor reproductive or obstetric history, second trimester: Secondary | ICD-10-CM

## 2015-03-01 DIAGNOSIS — Z8679 Personal history of other diseases of the circulatory system: Secondary | ICD-10-CM

## 2015-03-01 MED ORDER — ASPIRIN EC 81 MG PO TBEC: 81.0000 mg | DELAYED_RELEASE_TABLET | Freq: Every day | ORAL | Status: DC

## 2015-03-01 NOTE — Progress Notes (Signed)
MFM Consult, Staff Note:  1. Chronic hypertension (CHTN):   During our discussion, I reviewed hypertension as a cause of uteroplacental insufficiency, with increased risk of IUGR, oligohydramnios, and stillbirth. I told her that her hypertension also places her at increased risk for preeclampsia, describing the triad of increased blood pressure, proteinuria, and abnormal edema. Lastly, hypertension (severe range) increases the risk of placental abruption, especially in the setting of superimposed preeclampsia.  I reviewed the essential tenets in the most recent guidelines for management of hypertension in pregnancy in accordance with the American College of Obstetrics and Gynecology expert opinion. We talked about the medical treatment of hypertension in pregnancy. I outlined the different classes of medications, emphasizing that angiotensin enzyme inhibitors and angiotensin receptor blockers are contraindicated, and diuretics are relatively contraindicated. I told her that beta-blockers and calcium channel blockers are commonly used to treat hypertension in pregnancy, and that both are felt to be safe for use in pregnancy.   I outlined the usual plan of management for hypertension in pregnancy. She should have her blood pressure carefully followed, and her medications adjusted to keep her BP in the target range of around 130-159/70-109 mm/Hg; ie, HTN should not be treated (no medication adjustment) until 160/110 or greater measurements for blood pressures to be consistent with current ACOG/SMFM guidelines (ie, guidelines are 160/110 in absence of renal/cardiac disease during pregnancy).   Assuming all goes well I recommend delivery at 38-39 weeks.  2. Low dose aspirin for preeclampsia prevention in HTN  Given her risk factor of HTN, I recommend low-dose aspirin 81 mg tablet taken daily until 2 week before delivery (until 36-37 weeks). She will start today as it is important that this preventive  therapy be started prior to 20 weeks in this pregnancy.  Hx late Preterm birth (nonetheless = candidate for Makena): I reviewed with the patient the pathophysiology of preterm delivery in pregnancy and that often the etiology of the preterm delivery is not ascertained from the clinical scenario. In this patient's case she experienced a preterm delivery at 36 4/7 per patient or 36 6/7 wks per medical record which nonetheless qualifies as a preterm delivery and was studied in Meiss et al's SMFMU trial and found to benefit from weekly progesterone IM. Pregnancy medical home by [facility] Tracks this per practice and for the record I recommend Makena for this patient.  Infection may be a cause of preterm labor and preterm delivery. Advanced cervical dilation can also lead to infection; therefore, there are 2 mechanisms that could be contributing to the preterm delivery. Nonetheless the recurrence for spontaneous preterm birth is 2 times higher than the general population in women who experience a prior preterm delivery and the actual risk can be in the range of 50% to 60%. The risk of preterm delivery increases with each prior preterm birth. The most recent birth is the most predictive of risk for future preterm birth, which was demonstrated in the [place] study by Bakketeig et al in 1981. The risk increases further as the gestational age of the index preterm birth declines. The etiology of recurrent preterm delivery is often not clear but has been related to shortened cervix, infection and short inter-pregnancy interval.   I did remind the patient that there is no treatment for preterm delivery, but there are ways to follow the next pregnancy in order to make appropriate interventions.  I also advised the patient undergo weekly intramuscular injections of 17-hydroxyprogesterone caproate (17-OHP) from [redacted] wks GA until 36 weeks'   gestational age. This is a once per week intramuscular injection which is supported by  compelling scientific evidence to decrease the risk of recurrent preterm delivery in women with a prior history of preterm delivery. I did remind the patient that this is not a treatment for preterm delivery, but rather an intervention that may significantly reduce the recurrence of preterm delivery.   Summary of Recommendations: 1. 17 OHP (Makena) weekly until 36 weeks. 2. ASA 81mg po qd (until 2 weeks prior to delivery) 3. Repeat cervical length at 22-23 6/7 weeks (in 4 weeks with next growth) as this will provide an excellent risk assessment for preterm delivery 4. Interval growth monthly beginning at 22 weeks 5. Antenatal testing starting at 32 weeks 6. Treatment with antihypertensives only if severe range HTN is noted (ACOG guidelines) 7. 24 hour urine and baseline preeclampsia labs 8. Early 1 hour GTT and TSH screening (patient is overweight w/hx HTN) 9. Delivery at 38-39 weeks via timed IOL (see details above)  Time Spent: I spent in excess of 60 minutes in consultation with this patient to review records, evaluate her case, and provide her with an adequate discussion and education. More than 50% of this time was spent in direct face-to-face counseling.   It was a pleasure seeing your patient in the office today. Thank you for consultation. Please do not hesitate to contact our service for any further questions.  Thank you, Shamond Skelton Morgan Avey Mcmanamon  Antoine Fiallos Morgan, MD, MS, FACOG Assistant Professor Section of Maternal-Fetal Medicine 

## 2015-03-01 NOTE — Consult Note (Signed)
MFM Consult, Staff Note:  1. Chronic hypertension (CHTN):   During our discussion, I reviewed hypertension as a cause of uteroplacental insufficiency, with increased risk of IUGR, oligohydramnios, and stillbirth. I told her that her hypertension also places her at increased risk for preeclampsia, describing the triad of increased blood pressure, proteinuria, and abnormal edema. Lastly, hypertension (severe range) increases the risk of placental abruption, especially in the setting of superimposed preeclampsia.  I reviewed the essential tenets in the most recent guidelines for management of hypertension in pregnancy in accordance with the American College of Obstetrics and Gynecology expert opinion. We talked about the medical treatment of hypertension in pregnancy. I outlined the different classes of medications, emphasizing that angiotensin enzyme inhibitors and angiotensin receptor blockers are contraindicated, and diuretics are relatively contraindicated. I told her that beta-blockers and calcium channel blockers are commonly used to treat hypertension in pregnancy, and that both are felt to be safe for use in pregnancy.   I outlined the usual plan of management for hypertension in pregnancy. She should have her blood pressure carefully followed, and her medications adjusted to keep her BP in the target range of around 130-159/70-109 mm/Hg; ie, HTN should not be treated (no medication adjustment) until 160/110 or greater measurements for blood pressures to be consistent with current ACOG/SMFM guidelines (ie, guidelines are 160/110 in absence of renal/cardiac disease during pregnancy).   Assuming all goes well I recommend delivery at 38-39 weeks.  2. Low dose aspirin for preeclampsia prevention in HTN  Given her risk factor of HTN, I recommend low-dose aspirin 81 mg tablet taken daily until 2 week before delivery (until 36-37 weeks). She will start today as it is important that this preventive  therapy be started prior to 20 weeks in this pregnancy.  Hx late Preterm birth (nonetheless = candidate for Makena): I reviewed with the patient the pathophysiology of preterm delivery in pregnancy and that often the etiology of the preterm delivery is not ascertained from the clinical scenario. In this patient's case she experienced a preterm delivery at 67 4/7 per patient or 36 6/7 wks per medical record which nonetheless qualifies as a preterm delivery and was studied in Meiss et al's SMFMU trial and found to benefit from weekly progesterone IM. Pregnancy medical home by [facility] Tracks this per practice and for the record I recommend Makena for this patient.  Infection may be a cause of preterm labor and preterm delivery. Advanced cervical dilation can also lead to infection; therefore, there are 2 mechanisms that could be contributing to the preterm delivery. Nonetheless the recurrence for spontaneous preterm birth is 2 times higher than the general population in women who experience a prior preterm delivery and the actual risk can be in the range of 50% to 60%. The risk of preterm delivery increases with each prior preterm birth. The most recent birth is the most predictive of risk for future preterm birth, which was demonstrated in the [place] study by [employer] al in 1981. The risk increases further as the gestational age of the index preterm birth declines. The etiology of recurrent preterm delivery is often not clear but has been related to shortened cervix, infection and short inter-pregnancy interval.   I did remind the patient that there is no treatment for preterm delivery, but there are ways to follow the next pregnancy in order to make appropriate interventions.  I also advised the patient undergo weekly intramuscular injections of 17-hydroxyprogesterone caproate (17-OHP) from [redacted] wks GA until 36 weeks'  gestational age. This is a once per week intramuscular injection which is supported by  compelling scientific evidence to decrease the risk of recurrent preterm delivery in women with a prior history of preterm delivery. I did remind the patient that this is not a treatment for preterm delivery, but rather an intervention that may significantly reduce the recurrence of preterm delivery.   Summary of Recommendations: 1. 17 OHP (Makena) weekly until 36 weeks. 2. ASA  po qd (until 2 weeks prior to delivery) 3. Repeat cervical length at 22-23 6/7 weeks (in 4 weeks with next growth) as this will provide an excellent risk assessment for preterm delivery 4. Interval growth monthly beginning at 22 weeks 5. Antenatal testing starting at 32 weeks 6. Treatment with antihypertensives only if severe range HTN is noted (ACOG guidelines) 7. 24 hour urine and baseline preeclampsia labs 8. Early 1 hour GTT and TSH screening (patient is overweight w/hx HTN) 9. Delivery at 38-39 weeks via timed IOL (see details above)  Time Spent: I spent in excess of 60 minutes in consultation with this patient to review records, evaluate her case, and provide her with an adequate discussion and education. More than 50% of this time was spent in direct face-to-face counseling.   It was a pleasure seeing your patient in the office today. Thank you for consultation. Please do not hesitate to contact our service for any further questions.  Thank you, Louann Sjogren Gaynelle Arabian, Louann Sjogren, MD, MS, FACOG Assistant Professor Section of Maternal-Fetal Medicine

## 2015-03-01 NOTE — Addendum Note (Signed)
Encounter addended by: Durwin Nora, MD on: 03/01/2015  1:38 PM<BR>     Documentation filed: Charges VN

## 2015-03-02 ENCOUNTER — Other Ambulatory Visit: Payer: Self-pay | Admitting: Certified Nurse Midwife

## 2015-03-07 ENCOUNTER — Ambulatory Visit (INDEPENDENT_AMBULATORY_CARE_PROVIDER_SITE_OTHER): Payer: BC Managed Care – PPO | Admitting: Certified Nurse Midwife

## 2015-03-07 ENCOUNTER — Encounter: Payer: Self-pay | Admitting: Obstetrics

## 2015-03-07 VITALS — BP 119/76 | HR 76 | Temp 98.3°F | Wt 206.0 lb

## 2015-03-07 DIAGNOSIS — O09212 Supervision of pregnancy with history of pre-term labor, second trimester: Secondary | ICD-10-CM | POA: Diagnosis not present

## 2015-03-07 DIAGNOSIS — Z3492 Encounter for supervision of normal pregnancy, unspecified, second trimester: Secondary | ICD-10-CM

## 2015-03-07 DIAGNOSIS — O1492 Unspecified pre-eclampsia, second trimester: Secondary | ICD-10-CM

## 2015-03-07 DIAGNOSIS — O43122 Velamentous insertion of umbilical cord, second trimester: Secondary | ICD-10-CM

## 2015-03-07 DIAGNOSIS — O0992 Supervision of high risk pregnancy, unspecified, second trimester: Secondary | ICD-10-CM

## 2015-03-07 DIAGNOSIS — E669 Obesity, unspecified: Secondary | ICD-10-CM

## 2015-03-07 LAB — CBC
HEMATOCRIT: 37.2 % (ref 36.0–46.0)
Hemoglobin: 12.6 g/dL (ref 12.0–15.0)
MCH: 29.2 pg (ref 26.0–34.0)
MCHC: 33.9 g/dL (ref 30.0–36.0)
MCV: 86.3 fL (ref 78.0–100.0)
MPV: 10.1 fL (ref 8.6–12.4)
Platelets: 224 10*3/uL (ref 150–400)
RBC: 4.31 MIL/uL (ref 3.87–5.11)
RDW: 13.8 % (ref 11.5–15.5)
WBC: 11 10*3/uL — ABNORMAL HIGH (ref 4.0–10.5)

## 2015-03-07 LAB — POCT URINALYSIS DIPSTICK
Bilirubin, UA: NEGATIVE
Blood, UA: NEGATIVE
Glucose, UA: NEGATIVE
KETONES UA: NEGATIVE
Leukocytes, UA: NEGATIVE
Nitrite, UA: NEGATIVE
PH UA: 7
PROTEIN UA: NEGATIVE
Urobilinogen, UA: NEGATIVE

## 2015-03-07 LAB — HEMOGLOBIN A1C
HEMOGLOBIN A1C: 5.6 % (ref <5.7)
Mean Plasma Glucose: 114 mg/dL (ref <117)

## 2015-03-07 LAB — CREATININE, SERUM: Creat: 0.6 mg/dL (ref 0.50–1.10)

## 2015-03-07 LAB — AST: AST: 14 U/L (ref 10–30)

## 2015-03-07 LAB — ALT: ALT: 15 U/L (ref 6–29)

## 2015-03-07 LAB — LACTATE DEHYDROGENASE: LDH: 146 U/L (ref 94–250)

## 2015-03-07 MED ORDER — HYDROXYPROGESTERONE CAPROATE 250 MG/ML IM OIL
250.0000 mg | TOPICAL_OIL | INTRAMUSCULAR | Status: AC
  Administered 2015-03-07 – 2015-06-27 (×17): 250 mg via INTRAMUSCULAR

## 2015-03-07 NOTE — Progress Notes (Signed)
Pt received 17p injection at today's visit. Pt tolerated injection well.  Administrations This Visit    hydroxyprogesterone caproate (DELALUTIN) 250 mg/mL injection 250 mg    Admin Date Action Dose Route Administered By         03/07/2015 Given 250 mg Intramuscular Lanney Gins, CMA

## 2015-03-08 LAB — PATHOLOGIST SMEAR REVIEW

## 2015-03-08 LAB — PROTEIN / CREATININE RATIO, URINE
CREATININE, URINE: 18 mg/dL — AB (ref 20–320)
Total Protein, Urine: <4 mg/dL — ABNORMAL LOW (ref 5–24)

## 2015-03-09 DIAGNOSIS — O43129 Velamentous insertion of umbilical cord, unspecified trimester: Secondary | ICD-10-CM | POA: Insufficient documentation

## 2015-03-09 NOTE — Progress Notes (Signed)
Subjective:    Carla Morrow is a 35 y.o. female being seen today for her obstetrical visit. She is at [redacted]w[redacted]d gestation. Patient reports: no complaints . Fetal movement: normal.  Problem List Items Addressed This Visit    None    Visit Diagnoses    Supervision of high risk pregnancy in second trimester    -  Primary    Relevant Medications    hydroxyprogesterone caproate (DELALUTIN) 250 mg/mL injection 250 mg    Other Relevant Orders    POCT urinalysis dipstick (Completed)    Lactate dehydrogenase (Completed)    Creatinine, serum (Completed)    CBC (Completed)    ALT (Completed)    AST (Completed)    Pathologist smear review (Completed)    Protein / creatinine ratio, urine (Completed)    HgB A1c (Completed)    Creatinine clearance, urine, 24 hour    Protein, urine, 24 hour    Obesity        Relevant Orders    Lactate dehydrogenase (Completed)    Creatinine, serum (Completed)    CBC (Completed)    ALT (Completed)    AST (Completed)    Pathologist smear review (Completed)    Protein / creatinine ratio, urine (Completed)    HgB A1c (Completed)    Creatinine clearance, urine, 24 hour    Protein, urine, 24 hour    Preeclampsia, second trimester        Relevant Orders    Lactate dehydrogenase (Completed)    Creatinine, serum (Completed)    CBC (Completed)    ALT (Completed)    AST (Completed)    Pathologist smear review (Completed)    Protein / creatinine ratio, urine (Completed)    HgB A1c (Completed)    Creatinine clearance, urine, 24 hour    Protein, urine, 24 hour      Patient Active Problem List   Diagnosis Date Noted  . H/O premature delivery 02/08/2015  . History of hypertension 02/08/2015  . PROM (premature rupture of membranes) 09/27/2011  . Normal delivery 09/27/2011   Objective:    BP 119/76 mmHg  Pulse 76  Temp(Src) 98.3 F (36.8 C)  Wt 206 lb (93.441 kg)  LMP 10/20/2014 FHT: 150 BPM  Uterine Size: size equals dates     Assessment:    Pregnancy @ [redacted]w[redacted]d    Doing Well.   Obesity  Plan:   Started on 17-P injections Baseline PIH labs drawn  OBGCT: discussed. Signs and symptoms of preterm labor: discussed.  Labs, problem list reviewed and updated 2 hr GTT planned Follow up in 4 weeks.

## 2015-03-11 ENCOUNTER — Other Ambulatory Visit: Payer: Self-pay | Admitting: Obstetrics

## 2015-03-12 ENCOUNTER — Encounter: Payer: Self-pay | Admitting: Obstetrics

## 2015-03-12 ENCOUNTER — Other Ambulatory Visit: Payer: Self-pay | Admitting: *Deleted

## 2015-03-12 DIAGNOSIS — Z3492 Encounter for supervision of normal pregnancy, unspecified, second trimester: Secondary | ICD-10-CM

## 2015-03-12 NOTE — Progress Notes (Signed)
Orders placed for 24 hour urine.

## 2015-03-12 NOTE — Addendum Note (Signed)
Addended by: Burnell Blanks on: 03/12/2015 10:03 AM   Modules accepted: Orders

## 2015-03-13 LAB — PROTEIN, URINE, 24 HOUR
PROTEIN, URINE: 5 mg/dL (ref 5–24)
Protein, 24H Urine: 110 mg/24 h (ref <150)

## 2015-03-13 LAB — CREATININE CLEARANCE, URINE, 24 HOUR
CREAT CLEAR: 117 mL/min — AB (ref 75–115)
CREATININE 24H UR: 1.01 g/(24.h) (ref 0.63–2.50)
CREATININE, URINE: 46 mg/dL (ref 20–320)
CREATININE: 0.6 mg/dL (ref 0.50–1.10)

## 2015-03-15 ENCOUNTER — Ambulatory Visit (INDEPENDENT_AMBULATORY_CARE_PROVIDER_SITE_OTHER): Payer: BC Managed Care – PPO | Admitting: *Deleted

## 2015-03-15 VITALS — BP 121/77 | HR 79 | Temp 98.4°F | Wt 208.0 lb

## 2015-03-15 DIAGNOSIS — O09212 Supervision of pregnancy with history of pre-term labor, second trimester: Secondary | ICD-10-CM

## 2015-03-15 DIAGNOSIS — O09892 Supervision of other high risk pregnancies, second trimester: Secondary | ICD-10-CM

## 2015-03-15 NOTE — Progress Notes (Signed)
Patient is in the office for her Makena injection. She is doing well with the injection although she questions if it is making her itch. She states she has intermittent itching all over. Advise she can use benadryl before her injections to see if that helps- if it continues to be a problem- she will need to discuss it with her provider.

## 2015-03-21 ENCOUNTER — Ambulatory Visit (INDEPENDENT_AMBULATORY_CARE_PROVIDER_SITE_OTHER): Payer: BC Managed Care – PPO | Admitting: *Deleted

## 2015-03-21 VITALS — BP 116/74 | HR 83 | Temp 98.3°F | Wt 211.0 lb

## 2015-03-21 DIAGNOSIS — O09212 Supervision of pregnancy with history of pre-term labor, second trimester: Secondary | ICD-10-CM | POA: Diagnosis not present

## 2015-03-21 DIAGNOSIS — Z331 Pregnant state, incidental: Secondary | ICD-10-CM

## 2015-03-21 DIAGNOSIS — Z8751 Personal history of pre-term labor: Secondary | ICD-10-CM

## 2015-03-21 DIAGNOSIS — Z1389 Encounter for screening for other disorder: Secondary | ICD-10-CM

## 2015-03-21 NOTE — Progress Notes (Signed)
Patient in office for a 17-P injection.  Patient due for next injection in 1 week. Patient tolerated injection well.  BP 116/74 mmHg  Pulse 83  Temp(Src) 98.3 F (36.8 C)  Wt 211 lb (95.709 kg)  LMP 10/20/2014  Administrations This Visit    hydroxyprogesterone caproate (DELALUTIN) 250 mg/mL injection 250 mg    Admin Date Action Dose Route Administered By         03/21/2015 Given 250 mg Intramuscular Henriette Combs, LPN

## 2015-03-29 ENCOUNTER — Encounter: Payer: Self-pay | Admitting: Obstetrics

## 2015-03-29 ENCOUNTER — Ambulatory Visit (HOSPITAL_COMMUNITY)
Admission: RE | Admit: 2015-03-29 | Discharge: 2015-03-29 | Disposition: A | Discharge status: Home / Self Care | Payer: Medicaid Other | Source: Ambulatory Visit | Attending: Certified Nurse Midwife | Admitting: Certified Nurse Midwife

## 2015-03-29 ENCOUNTER — Encounter (HOSPITAL_COMMUNITY): Payer: Self-pay

## 2015-03-29 ENCOUNTER — Ambulatory Visit (INDEPENDENT_AMBULATORY_CARE_PROVIDER_SITE_OTHER): Payer: Medicaid Other | Admitting: *Deleted

## 2015-03-29 VITALS — BP 131/81 | HR 86 | Temp 98.1°F | Wt 212.0 lb

## 2015-03-29 DIAGNOSIS — Z3A22 22 weeks gestation of pregnancy: Secondary | ICD-10-CM | POA: Insufficient documentation

## 2015-03-29 DIAGNOSIS — O09212 Supervision of pregnancy with history of pre-term labor, second trimester: Secondary | ICD-10-CM | POA: Diagnosis not present

## 2015-03-29 DIAGNOSIS — O0932 Supervision of pregnancy with insufficient antenatal care, second trimester: Secondary | ICD-10-CM | POA: Diagnosis not present

## 2015-03-29 DIAGNOSIS — Z36 Encounter for antenatal screening of mother: Secondary | ICD-10-CM | POA: Insufficient documentation

## 2015-03-29 DIAGNOSIS — Z8679 Personal history of other diseases of the circulatory system: Secondary | ICD-10-CM

## 2015-03-29 DIAGNOSIS — O10012 Pre-existing essential hypertension complicating pregnancy, second trimester: Secondary | ICD-10-CM | POA: Insufficient documentation

## 2015-03-29 DIAGNOSIS — O43122 Velamentous insertion of umbilical cord, second trimester: Secondary | ICD-10-CM | POA: Insufficient documentation

## 2015-03-29 DIAGNOSIS — Z8751 Personal history of pre-term labor: Secondary | ICD-10-CM

## 2015-03-29 NOTE — Progress Notes (Signed)
Patient in office for a 17-P injection. Patient due in office for next injection next week.  BP 131/81 mmHg  Pulse 86  Temp(Src) 98.1 F (36.7 C)  Wt 212 lb (96.163 kg)  LMP 10/20/2014  Administrations This Visit    hydroxyprogesterone caproate (DELALUTIN) 250 mg/mL injection 250 mg    Admin Date Action Dose Route Administered By         03/29/2015 Given 250 mg Intramuscular Henriette Combs, LPN

## 2015-04-04 ENCOUNTER — Ambulatory Visit (INDEPENDENT_AMBULATORY_CARE_PROVIDER_SITE_OTHER): Payer: BC Managed Care – PPO | Admitting: Certified Nurse Midwife

## 2015-04-04 ENCOUNTER — Other Ambulatory Visit: Payer: Self-pay | Admitting: Certified Nurse Midwife

## 2015-04-04 VITALS — BP 118/80 | HR 93 | Temp 98.0°F | Wt 210.0 lb

## 2015-04-04 DIAGNOSIS — O0972 Supervision of high risk pregnancy due to social problems, second trimester: Secondary | ICD-10-CM

## 2015-04-04 LAB — POCT URINALYSIS DIPSTICK
Bilirubin, UA: NEGATIVE
Blood, UA: 50
GLUCOSE UA: NEGATIVE
Ketones, UA: NEGATIVE
LEUKOCYTES UA: NEGATIVE
Nitrite, UA: NEGATIVE
SPEC GRAV UA: 1.02
UROBILINOGEN UA: NEGATIVE
pH, UA: 5

## 2015-04-04 NOTE — Progress Notes (Signed)
Subjective:    Carla Morrow is a 35 y.o. female being seen today for her obstetrical visit. She is at [redacted]w[redacted]d gestation. Patient reports: no complaints . Fetal movement: normal.  Problem List Items Addressed This Visit    None     Patient Active Problem List   Diagnosis Date Noted  . Umbilical cord, velamentous insertion 03/09/2015  . H/O premature delivery 02/08/2015  . History of hypertension 02/08/2015  . PROM (premature rupture of membranes) 09/27/2011  . Normal delivery 09/27/2011   Objective:    BP 118/80 mmHg  Pulse 93  Temp(Src) 98 F (36.7 C)  Wt 210 lb (95.255 kg)  LMP 10/20/2014 FHT: 155 BPM  Uterine Size: size equals dates     Assessment:    Pregnancy @ [redacted]w[redacted]d    17-P injections  Plan:   Aeroflow breast pump information given to the patient.    OBGCT: discussed and ordered for next visit. Signs and symptoms of preterm labor: discussed.  Labs, problem list reviewed and updated 2 hr GTT planned Follow up in 4 weeks.

## 2015-04-04 NOTE — Addendum Note (Signed)
Addended by: Henriette Combs on: 04/04/2015 05:24 PM   Modules accepted: Orders

## 2015-04-05 ENCOUNTER — Encounter: Payer: BC Managed Care – PPO | Admitting: Certified Nurse Midwife

## 2015-04-12 ENCOUNTER — Ambulatory Visit (INDEPENDENT_AMBULATORY_CARE_PROVIDER_SITE_OTHER): Payer: BC Managed Care – PPO | Admitting: *Deleted

## 2015-04-12 VITALS — BP 141/95 | HR 84 | Temp 97.6°F | Wt 216.0 lb

## 2015-04-12 DIAGNOSIS — Z8751 Personal history of pre-term labor: Secondary | ICD-10-CM

## 2015-04-12 DIAGNOSIS — O09212 Supervision of pregnancy with history of pre-term labor, second trimester: Secondary | ICD-10-CM | POA: Diagnosis not present

## 2015-04-18 ENCOUNTER — Ambulatory Visit (INDEPENDENT_AMBULATORY_CARE_PROVIDER_SITE_OTHER): Payer: BC Managed Care – PPO | Admitting: *Deleted

## 2015-04-18 VITALS — BP 130/80 | HR 108 | Temp 98.2°F | Wt 218.0 lb

## 2015-04-18 DIAGNOSIS — Z8751 Personal history of pre-term labor: Secondary | ICD-10-CM

## 2015-04-18 DIAGNOSIS — O09212 Supervision of pregnancy with history of pre-term labor, second trimester: Secondary | ICD-10-CM

## 2015-04-18 NOTE — Progress Notes (Signed)
Patient in office for 17-P injection.  Patient tolerated injection well. Patient to return in one week for next injection.  \BP 130/80 mmHg  Pulse 108  Temp(Src) 98.2 F (36.8 C)  Wt 218 lb (98.884 kg)  LMP 10/20/2014   Administrations This Visit    hydroxyprogesterone caproate (DELALUTIN) 250 mg/mL injection 250 mg    Admin Date Action Dose Route Administered By         04/18/2015 Given 250 mg Intramuscular Henriette CombsAndrea L Hatton, LPN

## 2015-04-26 ENCOUNTER — Ambulatory Visit (INDEPENDENT_AMBULATORY_CARE_PROVIDER_SITE_OTHER): Payer: Medicaid Other | Admitting: *Deleted

## 2015-04-26 ENCOUNTER — Ambulatory Visit (HOSPITAL_COMMUNITY)
Admission: RE | Admit: 2015-04-26 | Discharge: 2015-04-26 | Disposition: A | Discharge status: Home / Self Care | Payer: Medicaid Other | Source: Ambulatory Visit | Attending: Certified Nurse Midwife | Admitting: Certified Nurse Midwife

## 2015-04-26 ENCOUNTER — Encounter (HOSPITAL_COMMUNITY): Payer: Self-pay

## 2015-04-26 ENCOUNTER — Other Ambulatory Visit (HOSPITAL_COMMUNITY): Payer: Self-pay | Admitting: Maternal and Fetal Medicine

## 2015-04-26 VITALS — BP 123/75 | HR 78 | Wt 221.0 lb

## 2015-04-26 DIAGNOSIS — O10012 Pre-existing essential hypertension complicating pregnancy, second trimester: Secondary | ICD-10-CM | POA: Insufficient documentation

## 2015-04-26 DIAGNOSIS — O09212 Supervision of pregnancy with history of pre-term labor, second trimester: Secondary | ICD-10-CM | POA: Insufficient documentation

## 2015-04-26 DIAGNOSIS — O43122 Velamentous insertion of umbilical cord, second trimester: Secondary | ICD-10-CM | POA: Insufficient documentation

## 2015-04-26 DIAGNOSIS — Z8679 Personal history of other diseases of the circulatory system: Secondary | ICD-10-CM

## 2015-04-26 DIAGNOSIS — Z3A26 26 weeks gestation of pregnancy: Secondary | ICD-10-CM

## 2015-04-26 DIAGNOSIS — O09892 Supervision of other high risk pregnancies, second trimester: Secondary | ICD-10-CM

## 2015-04-26 NOTE — Progress Notes (Signed)
Pt is in office today for 17p injection.  Pt tolerated injection well.  Administrations This Visit    hydroxyprogesterone caproate (DELALUTIN) 250 mg/mL injection 250 mg    Admin Date Action Dose Route Administered By         04/26/2015 Given 250 mg Intramuscular Lanney GinsSuzanne D Infantof Villagomez, CMA

## 2015-05-02 ENCOUNTER — Ambulatory Visit (INDEPENDENT_AMBULATORY_CARE_PROVIDER_SITE_OTHER): Payer: BC Managed Care – PPO | Admitting: Certified Nurse Midwife

## 2015-05-02 ENCOUNTER — Other Ambulatory Visit: Payer: BC Managed Care – PPO

## 2015-05-02 VITALS — BP 118/78 | HR 88 | Temp 97.7°F | Wt 220.0 lb

## 2015-05-02 DIAGNOSIS — O0992 Supervision of high risk pregnancy, unspecified, second trimester: Secondary | ICD-10-CM

## 2015-05-02 LAB — POCT URINALYSIS DIPSTICK
BILIRUBIN UA: NEGATIVE
Blood, UA: NEGATIVE
GLUCOSE UA: NEGATIVE
Ketones, UA: NEGATIVE
LEUKOCYTES UA: NEGATIVE
NITRITE UA: NEGATIVE
PH UA: 7
Protein, UA: NEGATIVE
Spec Grav, UA: 1.01
Urobilinogen, UA: NEGATIVE

## 2015-05-02 NOTE — Progress Notes (Signed)
Subjective:    Carla Morrow is a 35 y.o. female being seen today for her obstetrical visit. She is at 2434w5d gestation. Patient reports: no complaints . Fetal movement: normal.  Discussed possible BTL.  Discussed IUDs. Encouraged patient to research her options.    Problem List Items Addressed This Visit    None    Visit Diagnoses    Supervision of high risk pregnancy, antepartum, second trimester    -  Primary    Relevant Orders    Glucose Tolerance, 2 Hours w/1 Hour (Completed)    CBC (Completed)    HIV antibody (Completed)    RPR (Completed)    POCT urinalysis dipstick (Completed)      Patient Active Problem List   Diagnosis Date Noted  . Umbilical cord, velamentous insertion 03/09/2015  . H/O premature delivery 02/08/2015  . History of hypertension 02/08/2015  . PROM (premature rupture of membranes) 09/27/2011  . Normal delivery 09/27/2011   Objective:    BP 118/78 mmHg  Pulse 88  Temp(Src) 97.7 F (36.5 C)  Wt 220 lb (99.791 kg)  LMP 10/20/2014 FHT: 150's BPM  Uterine Size: size equals dates     Assessment:    Pregnancy @ 3634w5d    Supervision of high risk pregnancy  On 17-P injections  Plan:    OBGCT: ordered. Signs and symptoms of preterm labor: discussed.  Labs, problem list reviewed and updated 2 hr GTT planned Follow up in 2 weeks.

## 2015-05-03 LAB — CBC
HEMATOCRIT: 36.2 % (ref 34.0–46.6)
HEMOGLOBIN: 11.8 g/dL (ref 11.1–15.9)
MCH: 27.8 pg (ref 26.6–33.0)
MCHC: 32.6 g/dL (ref 31.5–35.7)
MCV: 85 fL (ref 79–97)
Platelets: 240 10*3/uL (ref 150–379)
RBC: 4.24 x10E6/uL (ref 3.77–5.28)
RDW: 14.6 % (ref 12.3–15.4)
WBC: 9.9 10*3/uL (ref 3.4–10.8)

## 2015-05-03 LAB — GLUCOSE TOLERANCE, 2 HOURS W/ 1HR
GLUCOSE, 1 HOUR: 156 mg/dL (ref 65–179)
GLUCOSE, 2 HOUR: 143 mg/dL (ref 65–152)
Glucose, Fasting: 91 mg/dL (ref 65–91)

## 2015-05-03 LAB — HIV ANTIBODY (ROUTINE TESTING W REFLEX): HIV SCREEN 4TH GENERATION: NONREACTIVE

## 2015-05-03 LAB — RPR: RPR: NONREACTIVE

## 2015-05-06 ENCOUNTER — Other Ambulatory Visit: Payer: Self-pay | Admitting: Certified Nurse Midwife

## 2015-05-09 ENCOUNTER — Other Ambulatory Visit: Payer: Self-pay | Admitting: Certified Nurse Midwife

## 2015-05-10 ENCOUNTER — Ambulatory Visit (INDEPENDENT_AMBULATORY_CARE_PROVIDER_SITE_OTHER): Payer: BC Managed Care – PPO | Admitting: *Deleted

## 2015-05-10 VITALS — BP 120/80 | HR 91 | Temp 98.3°F | Wt 224.0 lb

## 2015-05-10 DIAGNOSIS — O09213 Supervision of pregnancy with history of pre-term labor, third trimester: Secondary | ICD-10-CM | POA: Diagnosis not present

## 2015-05-10 DIAGNOSIS — Z8751 Personal history of pre-term labor: Secondary | ICD-10-CM

## 2015-05-10 NOTE — Progress Notes (Signed)
Patient in office today for a 17-P injection.  Patient tolerated injection.  Patient due for next injection in one week.   BP 120/80 mmHg  Pulse 91  Temp(Src) 98.3 F (36.8 C)  Wt 224 lb (101.606 kg)  LMP 10/20/2014   Administrations This Visit    hydroxyprogesterone caproate (DELALUTIN) 250 mg/mL injection 250 mg    Admin Date Action Dose Route Administered By         05/10/2015 Given 250 mg Intramuscular Henriette CombsAndrea L Hatton, LPN

## 2015-05-16 ENCOUNTER — Ambulatory Visit (INDEPENDENT_AMBULATORY_CARE_PROVIDER_SITE_OTHER): Payer: BC Managed Care – PPO | Admitting: Certified Nurse Midwife

## 2015-05-16 VITALS — BP 129/91 | HR 106 | Temp 98.4°F | Wt 219.0 lb

## 2015-05-16 DIAGNOSIS — O09213 Supervision of pregnancy with history of pre-term labor, third trimester: Secondary | ICD-10-CM

## 2015-05-16 DIAGNOSIS — Z3493 Encounter for supervision of normal pregnancy, unspecified, third trimester: Secondary | ICD-10-CM

## 2015-05-16 LAB — POCT URINALYSIS DIPSTICK
BILIRUBIN UA: NEGATIVE
GLUCOSE UA: NEGATIVE
Ketones, UA: NEGATIVE
Leukocytes, UA: NEGATIVE
NITRITE UA: NEGATIVE
Protein, UA: NEGATIVE
Spec Grav, UA: <=1.005
UROBILINOGEN UA: NEGATIVE
pH, UA: 7

## 2015-05-16 NOTE — Progress Notes (Signed)
Patient has no concerns today. 

## 2015-05-16 NOTE — Progress Notes (Signed)
Subjective:    Carla Morrow is a 35 y.o. female being seen today for her obstetrical visit. She is at 7124w5d gestation. Patient reports no complaints. Fetal movement: normal.  Problem List Items Addressed This Visit    None    Visit Diagnoses    Prenatal care, third trimester    -  Primary    Relevant Orders    POCT urinalysis dipstick (Completed)      Patient Active Problem List   Diagnosis Date Noted  . Umbilical cord, velamentous insertion 03/09/2015  . H/O premature delivery 02/08/2015  . History of hypertension 02/08/2015  . PROM (premature rupture of membranes) 09/27/2011  . Normal delivery 09/27/2011   Objective:    BP 129/91 mmHg  Pulse 106  Temp(Src) 98.4 F (36.9 C)  Wt 219 lb (99.338 kg)  LMP 10/20/2014 FHT:  130 BPM  Uterine Size: size equals dates  Presentation: cephalic     Assessment:    Pregnancy @ 8424w5d weeks   H/O preterm labor/delivery  On 17-P injections  Doing well.   Plan:    NSTs starting @32  weeks   labs reviewed, problem list updated Consent signed. GBS Planning TDAP offered  Rhogam given for RH negative Pediatrician: discussed. Infant feeding: plans to breastfeed. Maternity leave: discussed. Cigarette smoking: never smoked. Orders Placed This Encounter  Procedures  . POCT urinalysis dipstick   No orders of the defined types were placed in this encounter.   Follow up in 2 Weeks.

## 2015-05-24 ENCOUNTER — Other Ambulatory Visit (HOSPITAL_COMMUNITY): Payer: Self-pay | Admitting: Maternal and Fetal Medicine

## 2015-05-24 ENCOUNTER — Ambulatory Visit (INDEPENDENT_AMBULATORY_CARE_PROVIDER_SITE_OTHER): Payer: BC Managed Care – PPO | Admitting: *Deleted

## 2015-05-24 ENCOUNTER — Ambulatory Visit (HOSPITAL_COMMUNITY)
Admission: RE | Admit: 2015-05-24 | Discharge: 2015-05-24 | Disposition: A | Discharge status: Home / Self Care | Payer: Medicaid Other | Source: Ambulatory Visit | Attending: Certified Nurse Midwife | Admitting: Certified Nurse Midwife

## 2015-05-24 ENCOUNTER — Encounter (HOSPITAL_COMMUNITY): Payer: Self-pay

## 2015-05-24 VITALS — BP 136/81 | HR 76 | Wt 200.2 lb

## 2015-05-24 DIAGNOSIS — Z029 Encounter for administrative examinations, unspecified: Secondary | ICD-10-CM

## 2015-05-24 DIAGNOSIS — Z8751 Personal history of pre-term labor: Secondary | ICD-10-CM

## 2015-05-24 DIAGNOSIS — O10013 Pre-existing essential hypertension complicating pregnancy, third trimester: Secondary | ICD-10-CM | POA: Diagnosis not present

## 2015-05-24 DIAGNOSIS — Z3A30 30 weeks gestation of pregnancy: Secondary | ICD-10-CM | POA: Diagnosis not present

## 2015-05-24 DIAGNOSIS — O169 Unspecified maternal hypertension, unspecified trimester: Secondary | ICD-10-CM

## 2015-05-24 DIAGNOSIS — O43123 Velamentous insertion of umbilical cord, third trimester: Secondary | ICD-10-CM | POA: Diagnosis not present

## 2015-05-24 DIAGNOSIS — Z8679 Personal history of other diseases of the circulatory system: Secondary | ICD-10-CM

## 2015-05-24 DIAGNOSIS — O09213 Supervision of pregnancy with history of pre-term labor, third trimester: Secondary | ICD-10-CM

## 2015-05-31 ENCOUNTER — Ambulatory Visit (INDEPENDENT_AMBULATORY_CARE_PROVIDER_SITE_OTHER): Payer: BC Managed Care – PPO | Admitting: Certified Nurse Midwife

## 2015-05-31 VITALS — BP 128/85 | HR 76 | Temp 98.4°F | Wt 224.0 lb

## 2015-05-31 DIAGNOSIS — O09213 Supervision of pregnancy with history of pre-term labor, third trimester: Secondary | ICD-10-CM

## 2015-05-31 DIAGNOSIS — Z3493 Encounter for supervision of normal pregnancy, unspecified, third trimester: Secondary | ICD-10-CM

## 2015-05-31 LAB — POCT URINALYSIS DIPSTICK
BILIRUBIN UA: NEGATIVE
Glucose, UA: NEGATIVE
KETONES UA: NEGATIVE
LEUKOCYTES UA: NEGATIVE
Nitrite, UA: NEGATIVE
PH UA: 6
Protein, UA: NEGATIVE
Spec Grav, UA: 1.01
Urobilinogen, UA: NEGATIVE

## 2015-05-31 NOTE — Progress Notes (Signed)
Subjective:    Carla Morrow is a 35 y.o. female being seen today for her obstetrical visit. She is at 6730w6d gestation. Patient reports no complaints. Fetal movement: normal.  Problem List Items Addressed This Visit    None    Visit Diagnoses    Prenatal care, third trimester    -  Primary    Relevant Orders    POCT urinalysis dipstick (Completed)      Patient Active Problem List   Diagnosis Date Noted  . Umbilical cord, velamentous insertion 03/09/2015  . H/O premature delivery 02/08/2015  . History of hypertension 02/08/2015  . PROM (premature rupture of membranes) 09/27/2011  . Normal delivery 09/27/2011   Objective:    BP 128/85 mmHg  Pulse 76  Temp(Src) 98.4 F (36.9 C)  Wt 224 lb (101.606 kg)  LMP 10/20/2014 FHT:  145 BPM  Uterine Size: size equals dates  Presentation: unsure   NST: reactive, cat. 1 tracing, + accels, no decels, moderate variability, occasional contraction noted on Toco.    Assessment:    Pregnancy @ 5530w6d weeks   Reactive NST  Plan:     labs reviewed, problem list updated Consent signed. GBS planning TDAP offered  Rhogam given for RH negative Pediatrician: discussed. Infant feeding: plans to breastfeed. Maternity leave: discussed. Cigarette smoking: never smoked. Orders Placed This Encounter  Procedures  . POCT urinalysis dipstick   No orders of the defined types were placed in this encounter.   Follow up in 1 Week with NST.

## 2015-05-31 NOTE — Progress Notes (Signed)
Pt received 17p injection at today's visit.  Pt tolerated well.  Administrations This Visit    hydroxyprogesterone caproate (DELALUTIN) 250 mg/mL injection 250 mg    Admin Date Action Dose Route Administered By         05/31/2015 Given 250 mg Intramuscular Lanney GinsSuzanne D Adilyn Humes, CMA

## 2015-05-31 NOTE — Progress Notes (Signed)
Patient has no questions or concerns.

## 2015-06-04 ENCOUNTER — Telehealth: Payer: Self-pay

## 2015-06-04 NOTE — Telephone Encounter (Signed)
SPOKE WITH PATIENT, FMLA PAPERWORK ADDENDUM IS READY - SHE STATED WILL PICK UP

## 2015-06-07 ENCOUNTER — Encounter: Payer: BC Managed Care – PPO | Admitting: Certified Nurse Midwife

## 2015-06-07 ENCOUNTER — Encounter (HOSPITAL_COMMUNITY): Payer: Self-pay | Admitting: *Deleted

## 2015-06-07 ENCOUNTER — Inpatient Hospital Stay (HOSPITAL_COMMUNITY)
Admission: AD | Admit: 2015-06-07 | Discharge: 2015-06-07 | Disposition: A | Discharge status: Home / Self Care | Payer: Medicaid Other | Source: Ambulatory Visit | Attending: Obstetrics | Admitting: Obstetrics

## 2015-06-07 ENCOUNTER — Ambulatory Visit (INDEPENDENT_AMBULATORY_CARE_PROVIDER_SITE_OTHER): Payer: BC Managed Care – PPO | Admitting: Certified Nurse Midwife

## 2015-06-07 VITALS — BP 127/78 | HR 83 | Temp 98.7°F | Wt 227.0 lb

## 2015-06-07 DIAGNOSIS — Z3493 Encounter for supervision of normal pregnancy, unspecified, third trimester: Secondary | ICD-10-CM | POA: Diagnosis not present

## 2015-06-07 DIAGNOSIS — O4703 False labor before 37 completed weeks of gestation, third trimester: Secondary | ICD-10-CM | POA: Diagnosis not present

## 2015-06-07 DIAGNOSIS — Z331 Pregnant state, incidental: Secondary | ICD-10-CM

## 2015-06-07 DIAGNOSIS — O09293 Supervision of pregnancy with other poor reproductive or obstetric history, third trimester: Secondary | ICD-10-CM

## 2015-06-07 DIAGNOSIS — Z7982 Long term (current) use of aspirin: Secondary | ICD-10-CM | POA: Insufficient documentation

## 2015-06-07 DIAGNOSIS — O09213 Supervision of pregnancy with history of pre-term labor, third trimester: Secondary | ICD-10-CM | POA: Diagnosis not present

## 2015-06-07 DIAGNOSIS — O0993 Supervision of high risk pregnancy, unspecified, third trimester: Secondary | ICD-10-CM

## 2015-06-07 DIAGNOSIS — Z1389 Encounter for screening for other disorder: Secondary | ICD-10-CM

## 2015-06-07 DIAGNOSIS — Z3A32 32 weeks gestation of pregnancy: Secondary | ICD-10-CM | POA: Diagnosis not present

## 2015-06-07 LAB — POCT URINALYSIS DIPSTICK
BILIRUBIN UA: NEGATIVE
Glucose, UA: NEGATIVE
Ketones, UA: NEGATIVE
Leukocytes, UA: NEGATIVE
NITRITE UA: NEGATIVE
PH UA: 5
Protein, UA: NEGATIVE
RBC UA: NEGATIVE
Spec Grav, UA: 1.015
UROBILINOGEN UA: NEGATIVE

## 2015-06-07 LAB — URINALYSIS, ROUTINE W REFLEX MICROSCOPIC
Bilirubin Urine: NEGATIVE
GLUCOSE, UA: NEGATIVE mg/dL
KETONES UR: 15 mg/dL — AB
Leukocytes, UA: NEGATIVE
Nitrite: NEGATIVE
PROTEIN: NEGATIVE mg/dL
Specific Gravity, Urine: 1.02 (ref 1.005–1.030)
pH: 6 (ref 5.0–8.0)

## 2015-06-07 LAB — URINE MICROSCOPIC-ADD ON

## 2015-06-07 MED ORDER — LACTATED RINGERS IV BOLUS (SEPSIS)
1000.0000 mL | Freq: Once | INTRAVENOUS | Status: AC
  Administered 2015-06-07: 1000 mL via INTRAVENOUS

## 2015-06-07 NOTE — Progress Notes (Signed)
Subjective:    Carla Morrow is a 35 y.o. female being seen today for her obstetrical visit. She is at 4014w6d gestation. Patient reports no bleeding, no leaking and occasional contractions. Fetal movement: normal.  Problem List Items Addressed This Visit    None    Visit Diagnoses    Prenatal care, third trimester    -  Primary    Relevant Orders    POCT urinalysis dipstick    Supervision of high risk pregnancy in third trimester        Relevant Orders    Fetal fibronectin    H/O premature rupture of membranes in previous pregnancy, currently pregnant, third trimester        Relevant Orders    Fetal fibronectin      Patient Active Problem List   Diagnosis Date Noted  . Umbilical cord, velamentous insertion 03/09/2015  . H/O premature delivery 02/08/2015  . History of hypertension 02/08/2015  . PROM (premature rupture of membranes) 09/27/2011  . Normal delivery 09/27/2011   Objective:    BP 127/78 mmHg  Pulse 83  Temp(Src) 98.7 F (37.1 C)  Wt 227 lb (102.967 kg)  LMP 10/20/2014 FHT:  150 BPM  Uterine Size: size equals dates  Presentation: unsure   Cervix: 1 cm dilated, posterior, soft, long, ballotable.    NST: reactive, cat. 1 tracing, moderate variability, + accels, no decels.  Toco: contractions every 5-6 minutes, patient feels them.   Assessment:    Pregnancy @ 9614w6d weeks   Hx of PPROM and delivery at 36 weeks  On 17-P   Reactive NST  Preterm contractions on Toco Plan:   Sent to MAU for extended monitoring  FFN sent stat   labs reviewed, problem list updated Consent signed. GBS planning TDAP offered  Rhogam given for RH negative Pediatrician: discussed. Infant feeding: plans to breastfeed. Maternity leave: discussed. Cigarette smoking: never smoked. Orders Placed This Encounter  Procedures  . Fetal fibronectin  . POCT urinalysis dipstick   Meds ordered this encounter  Medications  . Prenat w/o [id number] (CITRANATAL HARMONY PO)   Sig: Take by mouth daily.   Follow up in 1 Week with NST.

## 2015-06-07 NOTE — Addendum Note (Signed)
Addended by: Luella CookMCINTYRE, Rafal Archuleta E on: 06/07/2015 03:01 PM   Modules accepted: Orders

## 2015-06-07 NOTE — MAU Provider Note (Signed)
History     CSN: [id number]  Arrival date and time: 06/07/15 1244   First Provider Initiated Contact with Patient 06/07/15 1317      Chief Complaint  Patient presents with  . Contractions   HPI Ms. Liberta Gimpel is a 35 y.o. [phone] at [redacted]w[redacted]d who presents to MAU today from the office for preterm contractions. The patient palpates minimal, irregular, mild contractions, however they were traced with EFM/Toco in the office. The patient had Wet prep and FFN collected in the office prior to cervical exam. She was 1 cm dilated in the office today. She has a history of PTD at [redacted]w[redacted]d and is receiving 17-P weekly. She denies vaginal bleeding, LOF, recent N/V/D or UTI symptoms. She reports good fetal movement.    OB History    Gravida Para Term Preterm AB TAB SAB Ectopic Multiple Living   0 0 0 0 0 3      Past Medical History  Diagnosis Date  . No pertinent past medical history   . Medical history non-contributory     Past Surgical History  Procedure Laterality Date  . No past surgeries      Family History  Problem Relation Age of Onset  . Anuerysm Mother   . Hypertension Father     Social History  Substance Use Topics  . Smoking status: Never Smoker   . Smokeless tobacco: Never Used  . Alcohol Use: No    Allergies: No Known Allergies  Facility-administered medications prior to admission  Medication Dose Route Frequency Provider Last Rate Last Dose  . hydroxyprogesterone caproate (DELALUTIN) 250 mg/mL injection 250 mg  250 mg Intramuscular Weekly Rachelle A Denney, CNM   250 mg at 06/07/15 1225   Prescriptions prior to admission  Medication Sig Dispense Refill Last Dose  . Prenatal Vit-Fe Fumarate-FA (PRENATAL MULTIVITAMIN) TABS tablet Take 1 tablet by mouth daily.   06/06/2015 at Unknown time  . aspirin EC 81 MG tablet Take 1 tablet (81 mg total) by mouth daily. (Patient not taking: Reported on 06/07/2015) 30 tablet 2 Not Taking at Unknown time    Review of  Systems  Constitutional: Negative for fever and malaise/fatigue.  Gastrointestinal: Positive for abdominal pain. Negative for nausea, vomiting, diarrhea and constipation.  Genitourinary: Negative for dysuria, urgency and frequency.       Neg - vaginal bleeding, discharge, LOF   Physical Exam   Blood pressure 134/81, pulse 87, temperature 98.3 F (36.8 C), temperature source Oral, resp. rate 18, height 5' 6.25" (1.683 m), weight 222 lb (100.699 kg), last menstrual period 10/20/2014, unknown if currently breastfeeding.  Physical Exam  Nursing note and vitals reviewed. Constitutional: She is oriented to person, place, and time. She appears well-developed and well-nourished. No distress.  HENT:  Head: Normocephalic and atraumatic.  Cardiovascular: Normal rate.   Respiratory: Effort normal.  GI: Soft. She exhibits no distension and no mass. There is no tenderness. There is no rebound and no guarding.  Neurological: She is alert and oriented to person, place, and time.  Skin: Skin is warm and dry. No erythema.  Psychiatric: She has a normal mood and affect.  Dilation: 1 Effacement (%): Thick Cervical Position: Posterior Exam by:: Vonzella Nipple, PA   Results for orders placed or performed during the hospital encounter of 06/07/15 (from the past 24 hour(s))  Urinalysis, Routine w reflex microscopic (not at [facility])     Status: Abnormal   Collection Time: 06/07/15 12:55 PM  Result Value  Ref Range   Color, Urine YELLOW YELLOW   APPearance CLEAR CLEAR   Specific Gravity, Urine 1.020 1.005 - 1.030   pH 6.0 5.0 - 8.0   Glucose, UA NEGATIVE NEGATIVE mg/dL   Hgb urine dipstick TRACE (A) NEGATIVE   Bilirubin Urine NEGATIVE NEGATIVE   Ketones, ur 15 (A) NEGATIVE mg/dL   Protein, ur NEGATIVE NEGATIVE mg/dL   Nitrite NEGATIVE NEGATIVE   Leukocytes, UA NEGATIVE NEGATIVE  Urine microscopic-add on     Status: Abnormal   Collection Time: 06/07/15 12:55 PM  Result Value Ref Range   Squamous  Epithelial / LPF 0-5 (A) NONE SEEN   WBC, UA 0-5 0 - 5 WBC/hpf   RBC / HPF 0-5 0 - 5 RBC/hpf   Bacteria, UA FEW (A) NONE SEEN   Fetal Monitoring: Baseline: 145 bpm Variability: moderate Accelerations: 15 x 15 Decelerations: one variable deceleration noted Contractions: irregular with moderate UI  MAU Course  Procedures None  MDM UA today shows some dehydration IV LR bolus given  Cervix unchanged from the office at first check and unchanged again prior to discharge. Scant blood noted after 2nd SVE in MAU. Patient advised of bleeding precautions.  FFN from office is pending at time of discharge. If positive, may need to consider BMZ at that time.  Assessment and Plan  A: SIUP at 2176w6d Preterm contractions   P: Discharge home Preterm labor precautions discussed Increased PO hydration advised Patient advised to follow-up with [facility] as scheduled for routine prenatal care or sooner PRN Patient may return to MAU as needed or if her condition were to change or worsen   Marny LowensteinJulie N Wenzel, PA-C  06/07/2015, 4:01 PM

## 2015-06-07 NOTE — MAU Note (Signed)
Urine sent to lab 

## 2015-06-07 NOTE — Addendum Note (Signed)
Addended by: Elby BeckPAUL, JANE F on: 06/07/2015 12:27 PM   Modules accepted: Orders

## 2015-06-07 NOTE — Progress Notes (Signed)
Patient has no questions or concerns today.

## 2015-06-07 NOTE — MAU Note (Signed)
Patient presents at [redacted] weeks gestation and sent from office to monitor for contractions. Fetus active. Denies bleeding or discharge.

## 2015-06-07 NOTE — Discharge Instructions (Signed)
Braxton Hicks Contractions Contractions of the uterus can occur throughout pregnancy. Contractions are not always a sign that you are in labor.  WHAT ARE BRAXTON HICKS CONTRACTIONS?  Contractions that occur before labor are called Braxton Hicks contractions, or false labor. Toward the end of pregnancy (32-34 weeks), these contractions can develop more often and may become more forceful. This is not true labor because these contractions do not result in opening (dilatation) and thinning of the cervix. They are sometimes difficult to tell apart from true labor because these contractions can be forceful and people have different pain tolerances. You should not feel embarrassed if you go to the hospital with false labor. Sometimes, the only way to tell if you are in true labor is for your health care provider to look for changes in the cervix. If there are no prenatal problems or other health problems associated with the pregnancy, it is completely safe to be sent home with false labor and await the onset of true labor. HOW CAN YOU TELL THE DIFFERENCE BETWEEN TRUE AND FALSE LABOR? False Labor  The contractions of false labor are usually shorter and not as hard as those of true labor.   The contractions are usually irregular.   The contractions are often felt in the front of the lower abdomen and in the groin.   The contractions may go away when you walk around or change positions while lying down.   The contractions get weaker and are shorter lasting as time goes on.   The contractions do not usually become progressively stronger, regular, and closer together as with true labor.  True Labor  Contractions in true labor last 30-70 seconds, become very regular, usually become more intense, and increase in frequency.   The contractions do not go away with walking.   The discomfort is usually felt in the top of the uterus and spreads to the lower abdomen and low back.   True labor can be  determined by your health care provider with an exam. This will show that the cervix is dilating and getting thinner.  WHAT TO REMEMBER  Keep up with your usual exercises and follow other instructions given by your health care provider.   Take medicines as directed by your health care provider.   Keep your regular prenatal appointments.   Eat and drink lightly if you think you are going into labor.   If Braxton Hicks contractions are making you uncomfortable:   Change your position from lying down or resting to walking, or from walking to resting.   Sit and rest in a tub of warm water.   Drink 2-3 glasses of water. Dehydration may cause these contractions.   Do slow and deep breathing several times an hour.  WHEN SHOULD I SEEK IMMEDIATE MEDICAL CARE? Seek immediate medical care if:  Your contractions become stronger, more regular, and closer together.   You have fluid leaking or gushing from your vagina.   You have a fever.   You pass blood-tinged mucus.   You have vaginal bleeding.   You have continuous abdominal pain.   You have low back pain that you never had before.   You feel your baby's head pushing down and causing pelvic pressure.   Your baby is not moving as much as it used to.    This information is not intended to replace advice given to you by your health care provider. Make sure you discuss any questions you have with your health care  provider.   Document Released: 01/26/2005 Document Revised: 01/31/2013 Document Reviewed: 11/07/2012 Elsevier Interactive Patient Education 2016 Elsevier Inc.  Pelvic Rest Pelvic rest is sometimes recommended for women when:   The placenta is partially or completely covering the opening of the cervix (placenta previa).  There is bleeding between the uterine wall and the amniotic sac in the first trimester (subchorionic hemorrhage).  The cervix begins to open without labor starting (incompetent  cervix, cervical insufficiency).  The labor is too early (preterm labor). HOME CARE INSTRUCTIONS  Do not have sexual intercourse, stimulation, or an orgasm.  Do not use tampons, douche, or put anything in the vagina.  Do not lift anything over 10 pounds (4.5 kg).  Avoid strenuous activity or straining your pelvic muscles. SEEK MEDICAL CARE IF:  You have any vaginal bleeding during pregnancy. Treat this as a potential emergency.  You have cramping pain felt low in the stomach (stronger than menstrual cramps).  You notice vaginal discharge (watery, mucus, or bloody).  You have a low, dull backache.  There are regular contractions or uterine tightening. SEEK IMMEDIATE MEDICAL CARE IF: You have vaginal bleeding and have placenta previa.    This information is not intended to replace advice given to you by your health care provider. Make sure you discuss any questions you have with your health care provider.   Document Released: 05/23/2010 Document Revised: 04/20/2011 Document Reviewed: 07/30/2014 Elsevier Interactive Patient Education 2016 [employer]  Preterm Labor Information Preterm labor is when labor starts at less than 37 weeks of pregnancy. The normal length of a pregnancy is 39 to 41 weeks. CAUSES Often, there is no identifiable underlying cause as to why a woman goes into preterm labor. One of the most common known causes of preterm labor is infection. Infections of the uterus, cervix, vagina, amniotic sac, bladder, kidney, or even the lungs (pneumonia) can cause labor to start. Other suspected causes of preterm labor include:   Urogenital infections, such as yeast infections and bacterial vaginosis.   Uterine abnormalities (uterine shape, uterine septum, fibroids, or bleeding from the placenta).   A cervix that has been operated on (it may fail to stay closed).   Malformations in the fetus.   Multiple gestations (twins, triplets, and so on).   Breakage of  the amniotic sac.  RISK FACTORS  Having a previous history of preterm labor.   Having premature rupture of membranes (PROM).   Having a placenta that covers the opening of the cervix (placenta previa).   Having a placenta that separates from the uterus (placental abruption).   Having a cervix that is too weak to hold the fetus in the uterus (incompetent cervix).   Having too much fluid in the amniotic sac (polyhydramnios).   Taking illegal drugs or smoking while pregnant.   Not gaining enough weight while pregnant.   Being younger than 5218 and older than 33 years old.   Having a low socioeconomic status.   Being African American. SYMPTOMS Signs and symptoms of preterm labor include:   Menstrual-like cramps, abdominal pain, or back pain.  Uterine contractions that are regular, as frequent as six in an hour, regardless of their intensity (may be mild or painful).  Contractions that start on the top of the uterus and spread down to the lower abdomen and back.   A sense of increased pelvic pressure.   A watery or bloody mucus discharge that comes from the vagina.  TREATMENT Depending on the length of the pregnancy and  other circumstances, your health care provider may suggest bed rest. If necessary, there are medicines that can be given to stop contractions and to mature the fetal lungs. If labor happens before 34 weeks of pregnancy, a prolonged hospital stay may be recommended. Treatment depends on the condition of both you and the fetus.  WHAT SHOULD YOU DO IF YOU THINK YOU ARE IN PRETERM LABOR? Call your health care provider right away. You will need to go to the hospital to get checked immediately. HOW CAN YOU PREVENT PRETERM LABOR IN FUTURE PREGNANCIES? You should:   Stop smoking if you smoke.  Maintain healthy weight gain and avoid chemicals and drugs that are not necessary.  Be watchful for any type of infection.  Inform your health care provider if  you have a known history of preterm labor.   This information is not intended to replace advice given to you by your health care provider. Make sure you discuss any questions you have with your health care provider.   Document Released: 04/18/2003 Document Revised: 09/28/2012 Document Reviewed: 02/29/2012 Elsevier Interactive Patient Education [employer]2016 Elsevier Inc.

## 2015-06-08 LAB — FETAL FIBRONECTIN: FETAL FIBRONECTIN: NEGATIVE

## 2015-06-10 LAB — NUSWAB VAGINITIS PLUS (VG+)
CANDIDA ALBICANS, NAA: NEGATIVE
CANDIDA GLABRATA, NAA: NEGATIVE
CHLAMYDIA TRACHOMATIS, NAA: NEGATIVE
Neisseria gonorrhoeae, NAA: NEGATIVE
Trich vag by NAA: NEGATIVE

## 2015-06-11 ENCOUNTER — Encounter (HOSPITAL_COMMUNITY): Payer: Self-pay

## 2015-06-11 ENCOUNTER — Ambulatory Visit (HOSPITAL_COMMUNITY)
Admission: RE | Admit: 2015-06-11 | Discharge: 2015-06-11 | Disposition: A | Discharge status: Home / Self Care | Payer: Medicaid Other | Source: Ambulatory Visit | Attending: Certified Nurse Midwife | Admitting: Certified Nurse Midwife

## 2015-06-11 DIAGNOSIS — Z3A33 33 weeks gestation of pregnancy: Secondary | ICD-10-CM | POA: Insufficient documentation

## 2015-06-11 DIAGNOSIS — O43123 Velamentous insertion of umbilical cord, third trimester: Secondary | ICD-10-CM | POA: Diagnosis not present

## 2015-06-11 DIAGNOSIS — O169 Unspecified maternal hypertension, unspecified trimester: Secondary | ICD-10-CM

## 2015-06-11 DIAGNOSIS — O10013 Pre-existing essential hypertension complicating pregnancy, third trimester: Secondary | ICD-10-CM | POA: Diagnosis not present

## 2015-06-11 DIAGNOSIS — O09219 Supervision of pregnancy with history of pre-term labor, unspecified trimester: Secondary | ICD-10-CM | POA: Insufficient documentation

## 2015-06-13 ENCOUNTER — Ambulatory Visit (INDEPENDENT_AMBULATORY_CARE_PROVIDER_SITE_OTHER): Payer: BC Managed Care – PPO | Admitting: Certified Nurse Midwife

## 2015-06-13 VITALS — BP 137/89 | HR 87 | Wt 229.0 lb

## 2015-06-13 DIAGNOSIS — O09213 Supervision of pregnancy with history of pre-term labor, third trimester: Secondary | ICD-10-CM

## 2015-06-13 DIAGNOSIS — Z3483 Encounter for supervision of other normal pregnancy, third trimester: Secondary | ICD-10-CM

## 2015-06-13 LAB — POCT URINALYSIS DIPSTICK
BILIRUBIN UA: NEGATIVE
Blood, UA: NEGATIVE
GLUCOSE UA: NEGATIVE
Ketones, UA: NEGATIVE
Leukocytes, UA: NEGATIVE
Nitrite, UA: NEGATIVE
PROTEIN UA: NEGATIVE
Spec Grav, UA: <=1.005
Urobilinogen, UA: NEGATIVE
pH, UA: 7

## 2015-06-13 NOTE — Progress Notes (Signed)
Subjective:    Carla Morrow is a 35 y.o. female being seen today for her obstetrical visit. She is at 629w5d gestation. Patient reports no complaints. Fetal movement: normal.  Problem List Items Addressed This Visit    None    Visit Diagnoses    Encounter for supervision of other normal pregnancy in third trimester    -  Primary    Relevant Orders    POCT urinalysis dipstick (Completed)      Patient Active Problem List   Diagnosis Date Noted  . Umbilical cord, velamentous insertion 03/09/2015  . H/O premature delivery 02/08/2015  . History of hypertension 02/08/2015  . PROM (premature rupture of membranes) 09/27/2011  . Normal delivery 09/27/2011   Objective:    BP 137/89 mmHg  Pulse 87  Wt 229 lb (103.874 kg)  LMP 10/20/2014 FHT:  145 BPM  Uterine Size: size equals dates  Presentation: cephalic   NST: reactive, cat. 1 tracing, + accels, no decels, moderate variability, occasional contraction about every 10 minutes  Assessment:    Pregnancy @ 1049w5d weeks   Reactive NST  17-P for hx of PPROM  Plan:     labs reviewed, problem list updated Consent signed. GBS planning TDAP offered  Rhogam given for RH negative Pediatrician: discussed. Infant feeding: plans to breastfeed. Maternity leave: discussed. Cigarette smoking: never smoked. Orders Placed This Encounter  Procedures  . POCT urinalysis dipstick   No orders of the defined types were placed in this encounter.   Follow up in 1 Week with NST.

## 2015-06-18 ENCOUNTER — Ambulatory Visit (HOSPITAL_COMMUNITY)
Admission: RE | Admit: 2015-06-18 | Discharge: 2015-06-18 | Disposition: A | Discharge status: Home / Self Care | Payer: Medicaid Other | Source: Ambulatory Visit | Attending: Certified Nurse Midwife | Admitting: Certified Nurse Midwife

## 2015-06-18 ENCOUNTER — Encounter (HOSPITAL_COMMUNITY): Payer: Self-pay

## 2015-06-18 ENCOUNTER — Other Ambulatory Visit (HOSPITAL_COMMUNITY): Payer: Self-pay | Admitting: Obstetrics and Gynecology

## 2015-06-18 ENCOUNTER — Ambulatory Visit (HOSPITAL_COMMUNITY): Payer: Medicaid Other

## 2015-06-18 DIAGNOSIS — O43123 Velamentous insertion of umbilical cord, third trimester: Secondary | ICD-10-CM | POA: Diagnosis not present

## 2015-06-18 DIAGNOSIS — O09213 Supervision of pregnancy with history of pre-term labor, third trimester: Secondary | ICD-10-CM

## 2015-06-18 DIAGNOSIS — O10013 Pre-existing essential hypertension complicating pregnancy, third trimester: Secondary | ICD-10-CM | POA: Insufficient documentation

## 2015-06-18 DIAGNOSIS — Z3A34 34 weeks gestation of pregnancy: Secondary | ICD-10-CM

## 2015-06-18 DIAGNOSIS — O169 Unspecified maternal hypertension, unspecified trimester: Secondary | ICD-10-CM

## 2015-06-21 ENCOUNTER — Ambulatory Visit (INDEPENDENT_AMBULATORY_CARE_PROVIDER_SITE_OTHER): Payer: BC Managed Care – PPO | Admitting: Certified Nurse Midwife

## 2015-06-21 VITALS — BP 127/86 | HR 88 | Wt 236.0 lb

## 2015-06-21 DIAGNOSIS — O09213 Supervision of pregnancy with history of pre-term labor, third trimester: Secondary | ICD-10-CM | POA: Diagnosis not present

## 2015-06-21 DIAGNOSIS — Z3493 Encounter for supervision of normal pregnancy, unspecified, third trimester: Secondary | ICD-10-CM

## 2015-06-21 LAB — POCT URINALYSIS DIPSTICK
BILIRUBIN UA: NEGATIVE
Blood, UA: NEGATIVE
GLUCOSE UA: NEGATIVE
Ketones, UA: NEGATIVE
LEUKOCYTES UA: NEGATIVE
NITRITE UA: NEGATIVE
Protein, UA: NEGATIVE
Spec Grav, UA: 1.01
UROBILINOGEN UA: NEGATIVE
pH, UA: 7

## 2015-06-21 NOTE — Progress Notes (Signed)
Subjective:    Carla Morrow is a 35 y.o. female being seen today for her obstetrical visit. She is at 3775w6d gestation. Patient reports no complaints. Fetal movement: normal.  Problem List Items Addressed This Visit    None    Visit Diagnoses    Prenatal care, third trimester    -  Primary    Relevant Orders    POCT urinalysis dipstick (Completed)      Patient Active Problem List   Diagnosis Date Noted  . Umbilical cord, velamentous insertion 03/09/2015  . H/O premature delivery 02/08/2015  . History of hypertension 02/08/2015  . PROM (premature rupture of membranes) 09/27/2011  . Normal delivery 09/27/2011   Objective:    BP 127/86 mmHg  Pulse 88  Wt 236 lb (107.049 kg)  LMP 10/20/2014 FHT:  145 BPM  Uterine Size: size equals dates  Presentation: cephalic   NST: + accels, no decels, moderate variability, cat. 1 tracing, no contractions on toco  Assessment:    Pregnancy @ 7275w6d weeks   Velamentous cord insertion  Reactive NST Plan:     labs reviewed, problem list updated Consent signed. GBS planning TDAP offered  Rhogam given for RH negative Pediatrician: discussed. Infant feeding: plans to breastfeed. Maternity leave: discussed. Cigarette smoking: never smoked. Orders Placed This Encounter  Procedures  . POCT urinalysis dipstick   No orders of the defined types were placed in this encounter.   Follow up in 1 Week with NST.

## 2015-06-25 ENCOUNTER — Ambulatory Visit (HOSPITAL_COMMUNITY)
Admission: RE | Admit: 2015-06-25 | Discharge: 2015-06-25 | Disposition: A | Discharge status: Home / Self Care | Payer: Medicaid Other | Source: Ambulatory Visit | Attending: Certified Nurse Midwife | Admitting: Certified Nurse Midwife

## 2015-06-25 ENCOUNTER — Encounter (HOSPITAL_COMMUNITY): Payer: Self-pay

## 2015-06-25 DIAGNOSIS — O10013 Pre-existing essential hypertension complicating pregnancy, third trimester: Secondary | ICD-10-CM | POA: Insufficient documentation

## 2015-06-25 DIAGNOSIS — O43123 Velamentous insertion of umbilical cord, third trimester: Secondary | ICD-10-CM | POA: Diagnosis not present

## 2015-06-25 DIAGNOSIS — Z3A35 35 weeks gestation of pregnancy: Secondary | ICD-10-CM | POA: Insufficient documentation

## 2015-06-25 DIAGNOSIS — O09213 Supervision of pregnancy with history of pre-term labor, third trimester: Secondary | ICD-10-CM | POA: Diagnosis not present

## 2015-06-25 DIAGNOSIS — O169 Unspecified maternal hypertension, unspecified trimester: Secondary | ICD-10-CM

## 2015-06-27 ENCOUNTER — Ambulatory Visit (INDEPENDENT_AMBULATORY_CARE_PROVIDER_SITE_OTHER): Payer: BC Managed Care – PPO | Admitting: Certified Nurse Midwife

## 2015-06-27 VITALS — BP 152/92 | HR 78

## 2015-06-27 DIAGNOSIS — O09213 Supervision of pregnancy with history of pre-term labor, third trimester: Secondary | ICD-10-CM

## 2015-06-27 DIAGNOSIS — O09513 Supervision of elderly primigravida, third trimester: Secondary | ICD-10-CM

## 2015-06-27 LAB — POCT URINALYSIS DIPSTICK
BILIRUBIN UA: NEGATIVE
Blood, UA: NEGATIVE
GLUCOSE UA: NEGATIVE
Ketones, UA: NEGATIVE
LEUKOCYTES UA: NEGATIVE
NITRITE UA: NEGATIVE
Protein, UA: NEGATIVE
Spec Grav, UA: 1.01
Urobilinogen, UA: NEGATIVE
pH, UA: 7

## 2015-06-27 LAB — OB RESULTS CONSOLE GBS: STREP GROUP B AG: POSITIVE

## 2015-06-27 NOTE — Progress Notes (Signed)
Subjective:    Carla Morrow is a 35 y.o. female being seen today for her obstetrical visit. She is at 6968w5d gestation. Patient reports no complaints. Fetal movement: normal.  Problem List Items Addressed This Visit    None    Visit Diagnoses    Encounter for supervision of other normal pregnancy in third trimester    -  Primary    Relevant Orders    Strep Gp B NAA    NuSwab VG+, Candida 6sp    POCT urinalysis dipstick (Completed)      Patient Active Problem List   Diagnosis Date Noted  . Umbilical cord, velamentous insertion 03/09/2015  . H/O premature delivery 02/08/2015  . History of hypertension 02/08/2015  . PROM (premature rupture of membranes) 09/27/2011  . Normal delivery 09/27/2011   Objective:    BP 152/92 mmHg  Pulse 78  LMP 10/20/2014 FHT:  140 BPM  Uterine Size: size equals dates  Presentation: cephalic   NST: + accels, no decels, moderate variability, cat. 1 tracing.    Assessment:    Pregnancy @ 1868w5d weeks   Reactive NST Plan:     labs reviewed, problem list updated Consent signed. GBS sent TDAP offered  Rhogam given for RH negative Pediatrician: discussed. Infant feeding: plans to breastfeed. Maternity leave: discussed. Cigarette smoking: never smoked. Orders Placed This Encounter  Procedures  . Strep Gp B NAA  . POCT urinalysis dipstick   No orders of the defined types were placed in this encounter.   Follow up in 1 Week with NST.

## 2015-06-29 LAB — STREP GP B NAA: Strep Gp B NAA: POSITIVE — AB

## 2015-06-30 ENCOUNTER — Other Ambulatory Visit: Payer: Self-pay | Admitting: Certified Nurse Midwife

## 2015-07-01 LAB — NUSWAB VG+, CANDIDA 6SP
CANDIDA ALBICANS, NAA: POSITIVE — AB
CANDIDA GLABRATA, NAA: NEGATIVE
CANDIDA KRUSEI, NAA: NEGATIVE
CANDIDA LUSITANIAE, NAA: NEGATIVE
CANDIDA TROPICALIS, NAA: NEGATIVE
Candida parapsilosis, NAA: NEGATIVE
Chlamydia trachomatis, NAA: NEGATIVE
Neisseria gonorrhoeae, NAA: NEGATIVE
Trich vag by NAA: NEGATIVE

## 2015-07-02 ENCOUNTER — Other Ambulatory Visit: Payer: Self-pay | Admitting: Certified Nurse Midwife

## 2015-07-02 ENCOUNTER — Ambulatory Visit (HOSPITAL_COMMUNITY)
Admission: RE | Admit: 2015-07-02 | Discharge: 2015-07-02 | Disposition: A | Discharge status: Home / Self Care | Payer: Medicaid Other | Source: Ambulatory Visit | Attending: Certified Nurse Midwife | Admitting: Certified Nurse Midwife

## 2015-07-02 DIAGNOSIS — O43123 Velamentous insertion of umbilical cord, third trimester: Secondary | ICD-10-CM | POA: Insufficient documentation

## 2015-07-02 DIAGNOSIS — O10013 Pre-existing essential hypertension complicating pregnancy, third trimester: Secondary | ICD-10-CM | POA: Diagnosis not present

## 2015-07-02 DIAGNOSIS — Z3A36 36 weeks gestation of pregnancy: Secondary | ICD-10-CM | POA: Diagnosis not present

## 2015-07-02 DIAGNOSIS — O09213 Supervision of pregnancy with history of pre-term labor, third trimester: Secondary | ICD-10-CM | POA: Insufficient documentation

## 2015-07-02 DIAGNOSIS — O169 Unspecified maternal hypertension, unspecified trimester: Secondary | ICD-10-CM

## 2015-07-02 DIAGNOSIS — B373 Candidiasis of vulva and vagina: Secondary | ICD-10-CM

## 2015-07-02 DIAGNOSIS — B3731 Acute candidiasis of vulva and vagina: Secondary | ICD-10-CM

## 2015-07-02 MED ORDER — FLUCONAZOLE 100 MG PO TABS: 100.0000 mg | ORAL_TABLET | Freq: Once | ORAL | Status: DC

## 2015-07-05 ENCOUNTER — Encounter: Payer: BC Managed Care – PPO | Admitting: Certified Nurse Midwife

## 2015-07-07 ENCOUNTER — Encounter (HOSPITAL_COMMUNITY): Payer: Self-pay

## 2015-07-07 ENCOUNTER — Inpatient Hospital Stay (HOSPITAL_COMMUNITY)
Admission: AD | Admit: 2015-07-07 | Discharge: 2015-07-09 | DRG: 775 | Disposition: A | Discharge status: Home / Self Care | Payer: Medicaid Other | Source: Ambulatory Visit | Attending: Obstetrics | Admitting: Obstetrics

## 2015-07-07 DIAGNOSIS — Z3A37 37 weeks gestation of pregnancy: Secondary | ICD-10-CM

## 2015-07-07 DIAGNOSIS — O99824 Streptococcus B carrier state complicating childbirth: Secondary | ICD-10-CM | POA: Diagnosis present

## 2015-07-07 DIAGNOSIS — O4202 Full-term premature rupture of membranes, onset of labor within 24 hours of rupture: Principal | ICD-10-CM | POA: Diagnosis present

## 2015-07-07 LAB — URINALYSIS, ROUTINE W REFLEX MICROSCOPIC
BILIRUBIN URINE: NEGATIVE
Glucose, UA: NEGATIVE mg/dL
Ketones, ur: NEGATIVE mg/dL
NITRITE: NEGATIVE
Protein, ur: NEGATIVE mg/dL
SPECIFIC GRAVITY, URINE: 1.01 (ref 1.005–1.030)
pH: 5.5 (ref 5.0–8.0)

## 2015-07-07 LAB — RPR: RPR Ser Ql: NONREACTIVE

## 2015-07-07 LAB — COMPREHENSIVE METABOLIC PANEL
ALK PHOS: 140 U/L — AB (ref 38–126)
ALT: 18 U/L (ref 14–54)
ANION GAP: 11 (ref 5–15)
AST: 27 U/L (ref 15–41)
Albumin: 2.8 g/dL — ABNORMAL LOW (ref 3.5–5.0)
BUN: 9 mg/dL (ref 6–20)
CALCIUM: 9.2 mg/dL (ref 8.9–10.3)
CO2: 18 mmol/L — AB (ref 22–32)
Chloride: 107 mmol/L (ref 101–111)
Creatinine, Ser: 0.54 mg/dL (ref 0.44–1.00)
Glucose, Bld: 100 mg/dL — ABNORMAL HIGH (ref 65–99)
Potassium: 4.2 mmol/L (ref 3.5–5.1)
SODIUM: 136 mmol/L (ref 135–145)
Total Bilirubin: 0.3 mg/dL (ref 0.3–1.2)
Total Protein: 6.3 g/dL — ABNORMAL LOW (ref 6.5–8.1)

## 2015-07-07 LAB — URINE MICROSCOPIC-ADD ON

## 2015-07-07 LAB — CBC
HCT: 35.1 % — ABNORMAL LOW (ref 36.0–46.0)
Hemoglobin: 11.8 g/dL — ABNORMAL LOW (ref 12.0–15.0)
MCH: 28.1 pg (ref 26.0–34.0)
MCHC: 33.6 g/dL (ref 30.0–36.0)
MCV: 83.6 fL (ref 78.0–100.0)
PLATELETS: 211 10*3/uL (ref 150–400)
RBC: 4.2 MIL/uL (ref 3.87–5.11)
RDW: 15 % (ref 11.5–15.5)
WBC: 11.1 10*3/uL — ABNORMAL HIGH (ref 4.0–10.5)

## 2015-07-07 LAB — PROTEIN / CREATININE RATIO, URINE
CREATININE, URINE: 26 mg/dL
PROTEIN CREATININE RATIO: 0.27 mg/mg{creat} — AB (ref 0.00–0.15)
TOTAL PROTEIN, URINE: 7 mg/dL

## 2015-07-07 LAB — TYPE AND SCREEN
ABO/RH(D): A POS
Antibody Screen: NEGATIVE

## 2015-07-07 LAB — POCT FERN TEST: POCT Fern Test: POSITIVE

## 2015-07-07 MED ORDER — DIBUCAINE 1 % RE OINT: 1.0000 "application " | TOPICAL_OINTMENT | RECTAL | Status: DC | PRN

## 2015-07-07 MED ORDER — ACETAMINOPHEN 325 MG PO TABS: 650.0000 mg | ORAL_TABLET | ORAL | Status: DC | PRN

## 2015-07-07 MED ORDER — ONDANSETRON HCL 4 MG PO TABS: 4.0000 mg | ORAL_TABLET | ORAL | Status: DC | PRN

## 2015-07-07 MED ORDER — LACTATED RINGERS IV SOLN: 500.0000 mL | INTRAVENOUS | Status: DC | PRN

## 2015-07-07 MED ORDER — SOD CITRATE-CITRIC ACID 500-334 MG/5ML PO SOLN: 30.0000 mL | ORAL | Status: DC | PRN

## 2015-07-07 MED ORDER — LIDOCAINE HCL (PF) 1 % IJ SOLN
30.0000 mL | INTRAMUSCULAR | Status: DC | PRN
  Filled 2015-07-07: qty 30

## 2015-07-07 MED ORDER — BUTORPHANOL TARTRATE 1 MG/ML IJ SOLN
1.0000 mg | INTRAMUSCULAR | Status: DC | PRN
  Administered 2015-07-07 (×3): 1 mg via INTRAVENOUS
  Filled 2015-07-07 (×3): qty 1

## 2015-07-07 MED ORDER — FERROUS SULFATE 325 (65 FE) MG PO TABS
325.0000 mg | ORAL_TABLET | Freq: Two times a day (BID) | ORAL | Status: DC
  Administered 2015-07-07 – 2015-07-08 (×3): 325 mg via ORAL
  Filled 2015-07-07 (×3): qty 1

## 2015-07-07 MED ORDER — ONDANSETRON HCL 4 MG/2ML IJ SOLN: 4.0000 mg | Freq: Four times a day (QID) | INTRAMUSCULAR | Status: DC | PRN

## 2015-07-07 MED ORDER — TETANUS-DIPHTH-ACELL PERTUSSIS 5-2.5-18.5 LF-MCG/0.5 IM SUSP: 0.5000 mL | Freq: Once | INTRAMUSCULAR | Status: DC

## 2015-07-07 MED ORDER — SENNOSIDES-DOCUSATE SODIUM 8.6-50 MG PO TABS
2.0000 | ORAL_TABLET | ORAL | Status: DC
  Administered 2015-07-08 (×2): 2 via ORAL
  Filled 2015-07-07 (×2): qty 2

## 2015-07-07 MED ORDER — PRENATAL MULTIVITAMIN CH
1.0000 | ORAL_TABLET | Freq: Every day | ORAL | Status: DC
Start: 2015-07-07 — End: 2015-07-09
  Administered 2015-07-07 – 2015-07-08 (×2): 1 via ORAL
  Filled 2015-07-07 (×2): qty 1

## 2015-07-07 MED ORDER — ONDANSETRON HCL 4 MG/2ML IJ SOLN: 4.0000 mg | INTRAMUSCULAR | Status: DC | PRN

## 2015-07-07 MED ORDER — LACTATED RINGERS IV SOLN
INTRAVENOUS | Status: DC
  Administered 2015-07-07: 05:00:00 via INTRAVENOUS

## 2015-07-07 MED ORDER — OXYTOCIN BOLUS FROM INFUSION
500.0000 mL | INTRAVENOUS | Status: DC
  Administered 2015-07-07: 500 mL via INTRAVENOUS

## 2015-07-07 MED ORDER — BENZOCAINE-MENTHOL 20-0.5 % EX AERO
1.0000 "application " | INHALATION_SPRAY | CUTANEOUS | Status: DC | PRN
  Filled 2015-07-07: qty 56

## 2015-07-07 MED ORDER — FLEET ENEMA 7-19 GM/118ML RE ENEM: 1.0000 | ENEMA | RECTAL | Status: DC | PRN

## 2015-07-07 MED ORDER — DEXTROSE 5 % IV SOLN
5.0000 10*6.[IU] | Freq: Once | INTRAVENOUS | Status: AC
  Administered 2015-07-07: 5 10*6.[IU] via INTRAVENOUS
  Filled 2015-07-07: qty 5

## 2015-07-07 MED ORDER — SIMETHICONE 80 MG PO CHEW: 80.0000 mg | CHEWABLE_TABLET | ORAL | Status: DC | PRN

## 2015-07-07 MED ORDER — OXYCODONE-ACETAMINOPHEN 5-325 MG PO TABS: 2.0000 | ORAL_TABLET | ORAL | Status: DC | PRN

## 2015-07-07 MED ORDER — IBUPROFEN 600 MG PO TABS
600.0000 mg | ORAL_TABLET | Freq: Four times a day (QID) | ORAL | Status: DC
  Administered 2015-07-07 – 2015-07-09 (×8): 600 mg via ORAL
  Filled 2015-07-07 (×8): qty 1

## 2015-07-07 MED ORDER — OXYCODONE-ACETAMINOPHEN 5-325 MG PO TABS: 1.0000 | ORAL_TABLET | ORAL | Status: DC | PRN

## 2015-07-07 MED ORDER — ZOLPIDEM TARTRATE 5 MG PO TABS: 5.0000 mg | ORAL_TABLET | Freq: Every evening | ORAL | Status: DC | PRN

## 2015-07-07 MED ORDER — PENICILLIN G POTASSIUM 5000000 UNITS IJ SOLR
2.5000 10*6.[IU] | INTRAMUSCULAR | Status: DC
  Administered 2015-07-07: 2.5 10*6.[IU] via INTRAVENOUS
  Filled 2015-07-07 (×2): qty 2.5

## 2015-07-07 MED ORDER — WITCH HAZEL-GLYCERIN EX PADS: 1.0000 "application " | MEDICATED_PAD | CUTANEOUS | Status: DC | PRN

## 2015-07-07 MED ORDER — DIPHENHYDRAMINE HCL 25 MG PO CAPS: 25.0000 mg | ORAL_CAPSULE | Freq: Four times a day (QID) | ORAL | Status: DC | PRN

## 2015-07-07 MED ORDER — OXYTOCIN 40 UNITS IN LACTATED RINGERS INFUSION - SIMPLE MED
2.5000 [IU]/h | INTRAVENOUS | Status: DC
  Filled 2015-07-07: qty 1000

## 2015-07-07 MED ORDER — COCONUT OIL OIL: 1.0000 "application " | TOPICAL_OIL | Status: DC | PRN

## 2015-07-07 NOTE — Lactation Note (Signed)
This note was copied from a baby's chart. Lactation Consultation Note  Patient Name: Carla Morrow [id number]'s Date: 07/07/2015 Reason for consult: Initial assessment   Initial consult at 8 hrs old; GA 37.1; BW 6 lbs, 11.1.  Mom is a P4.  Mom is experienced with only 1 child for 3 months. Infant has breastfed x3 (15-40 min) + formula bottle x1 (40 ml); voids-2; stools-1.  Mom stated she plans to breast and formula feed at home.   [facility]   Discussed LPTI behaviors to watch for even though infant is 37.1.  Green LPTI Sheet given and encouraged parents to limit supplementing amount to guidelines.   Educated on size of infant's stomach, feeding cues, and cluster feeding.  Encouraged parents to feed with cues and waking infant every 2.5 hrs if infant is not waking for feedings.   Educated on supply and demand.  Encouraged breastfeeding first prior to supplementing with formula.   Mom reports feeling a tug with feedings.   Reviewed hand expression with return demonstration and observation of colostrum easily expressed. Mom has large soft breast and areolas with erect nipples.    Lactation brochure given and informed of hospital support group and outpatient services. Mom stated her insurance company does not cover breast pumps; does not have WIC.  Hand pump given and demonstrated how to use; spoons given for EBM supplementation.   Encouraged mom to call for assistance with breastfeeding as needed.  Maternal Data Formula Feeding for Exclusion: Yes Reason for exclusion: Mother's choice to formula and breast feed on admission Has patient been taught Hand Expression?: Yes Does the patient have breastfeeding experience prior to this delivery?: Yes  Feeding Feeding Type: Formula  LATCH Score/Interventions                      Lactation Tools Discussed/Used WIC Program: No   Consult Status Consult Status: Follow-up Date: 07/08/15 Follow-up  type: In-patient    Carla Morrow, Carla Morrow 07/07/2015, 6:27 PM

## 2015-07-07 NOTE — MAU Note (Signed)
At 0155 leaking clear fluid.  Contractions stronger since 2 am.  Baby moving well.  Blood tinge with fluid, no other bleeding.

## 2015-07-07 NOTE — H&P (Signed)
This is Dr. Francoise CeoBernard Chevie Morrow dictating the history and physical on  Carla Morrow  she's a 35 year old gravida 4 para 03/12/2001 [facility]2015 positive GBS got penicillin her membranes ruptured 11:30 last night she is a 3 cm 90% vertex -2 fluids clear and she is contracting irregularly Past medical history negative Past surgical history negative Social history negative System review negative Physical exam well-developed female in early labor HEENT negative Lungs clear to P&A Heart regular rhythm no murmurs no gallops Breasts negative Abdomen term Pelvic as described above Extremities negative

## 2015-07-08 LAB — CBC
HEMATOCRIT: 30.4 % — AB (ref 36.0–46.0)
HEMOGLOBIN: 10 g/dL — AB (ref 12.0–15.0)
MCH: 28 pg (ref 26.0–34.0)
MCHC: 32.9 g/dL (ref 30.0–36.0)
MCV: 85.2 fL (ref 78.0–100.0)
Platelets: 174 10*3/uL (ref 150–400)
RBC: 3.57 MIL/uL — ABNORMAL LOW (ref 3.87–5.11)
RDW: 15.2 % (ref 11.5–15.5)
WBC: 13.7 10*3/uL — ABNORMAL HIGH (ref 4.0–10.5)

## 2015-07-08 NOTE — Progress Notes (Signed)
Patient ID: Carla CelesteChristina Morrow, female   DOB: [birth year], 35 y.o.   MRN: [id number] Postpartum day 1 Blood pressure 130/94 pulse 58 respiration 20 Fundus firm Lochia moderate Legs negative doing well

## 2015-07-08 NOTE — Lactation Note (Signed)
This note was copied from a baby's chart. Lactation Consultation Note  Patient Name: Boy Candelaria CelesteChristina Anderson ZOXWR'UToday's Date: 07/08/2015 Reason for consult: Follow-up assessment  With this mom of an early term baby, now 37 2/7 weeks CGA, and breastfeeding well. Mom has lots of colostrum with hand expression, nad has only supplemented with formula twice. I advised mom to just breastfeed, since he seems satiated, and is BFing fior 20-45 minutes at a time. More than adequate voids and stools for 28 hours of life. Mom to call for latach to be observed, and for any other questions/concerns.    Maternal Data    Feeding Feeding Type: Breast Fed Length of feed: 20 min  LATCH Score/Interventions Latch: Grasps breast easily, tongue down, lips flanged, rhythmical sucking.  Audible Swallowing:  (lots of easily expressed colostrum)  Type of Nipple: Everted at rest and after stimulation  Comfort (Breast/Nipple): Soft / non-tender           Lactation Tools Discussed/Used     Consult Status Consult Status: Follow-up Date: 07/09/15 Follow-up type: In-patient    Alfred LevinsLee, Enaya Howze Anne 07/08/2015, 2:04 PM

## 2015-07-09 ENCOUNTER — Ambulatory Visit (HOSPITAL_COMMUNITY): Admission: RE | Admit: 2015-07-09 | Payer: Medicaid Other | Source: Ambulatory Visit

## 2015-07-09 ENCOUNTER — Ambulatory Visit (HOSPITAL_COMMUNITY): Payer: Medicaid Other

## 2015-07-09 MED ORDER — IRON POLYSACCH CMPLX-B12-FA 150-0.025-1 MG PO CAPS: 1.0000 | ORAL_CAPSULE | Freq: Every day | ORAL | Status: DC

## 2015-07-09 MED ORDER — IBUPROFEN 600 MG PO TABS: 600.0000 mg | ORAL_TABLET | Freq: Four times a day (QID) | ORAL | Status: DC

## 2015-07-09 MED ORDER — SENNOSIDES-DOCUSATE SODIUM 8.6-50 MG PO TABS: 2.0000 | ORAL_TABLET | Freq: Two times a day (BID) | ORAL | Status: DC

## 2015-07-09 MED ORDER — COCONUT OIL OIL: 1.0000 "application " | TOPICAL_OIL | Status: DC | PRN

## 2015-07-09 MED ORDER — OXYCODONE-ACETAMINOPHEN 5-325 MG PO TABS: 1.0000 | ORAL_TABLET | ORAL | Status: DC | PRN

## 2015-07-09 MED ORDER — LABETALOL HCL 100 MG PO TABS: 200.0000 mg | ORAL_TABLET | Freq: Two times a day (BID) | ORAL | Status: DC

## 2015-07-09 MED ORDER — PRENATE PIXIE 10-0.6-0.4-200 MG PO CAPS: 1.0000 | ORAL_CAPSULE | Freq: Every day | ORAL | Status: DC

## 2015-07-09 NOTE — Lactation Note (Signed)
This note was copied from a baby's chart. Lactation Consultation Note  Patient Name: Carla Morrow HYQMV'HToday's Date: 07/09/2015 Reason for consult: Follow-up assessment;Other (Comment) (early term baby at 37.1 wks. ) Mom reports she feels baby is latching well, some mild tenderness reported. Per chart review, baby has been to breast 12 times in past 24 hours on average for 20 minutes, Mom supplemented X1. Baby has had 4.1% weight loss in 24 hours. 7 voids/1 stool. Offered to observe latch before d/c today, Mom declined. Encouraged Mom to continue to supplement with feedings till baby's weight loss stabilizes. Encouraged to post pump after feeding few times per day to have EBM to supplement and encourage milk production. Mom has hand pump. Discussed WIC loaner program, Mom will advise. Engorgement care reviewed if needed, advised of OP services and support group. Encouraged to call for questions/concerns.   Maternal Data    Feeding Feeding Type: Bottle Fed - Formula Nipple Type: Slow - flow  LATCH Score/Interventions                      Lactation Tools Discussed/Used Tools: Pump Breast pump type: Manual   Consult Status Consult Status: Complete Date: 07/09/15 Follow-up type: In-patient    Alfred LevinsGranger, Carla Morrow 07/09/2015, 10:10 AM

## 2015-07-09 NOTE — Discharge Summary (Signed)
Obstetric Discharge Summary Reason for Admission: onset of labor Prenatal Procedures: NST and ultrasound Intrapartum Procedures: spontaneous vaginal delivery Postpartum Procedures: none Complications-Operative and Postpartum: none HEMOGLOBIN  Date Value Ref Range Status  07/08/2015 10.0* 12.0 - 15.0 g/dL Final   HCT  Date Value Ref Range Status  07/08/2015 30.4* 36.0 - 46.0 % Final   HEMATOCRIT  Date Value Ref Range Status  05/02/2015 36.2 34.0 - 46.6 % Final    Physical Exam:  General: alert, cooperative and no distress Lochia: appropriate Uterine Fundus: firm Incision: no significant drainage DVT Evaluation: No evidence of DVT seen on physical exam. No cords or calf tenderness. No significant calf/ankle edema.  Discharge Diagnoses: Term Pregnancy-delivered  Discharge Information: Date: 07/09/2015 Activity: pelvic rest Diet: routine Medications: PNV, Ibuprofen, Colace, Iron and Percocet Condition: stable Instructions: refer to practice specific booklet Discharge to: home   Newborn Data: Live born female  Birth Weight: 6 lb 11.1 oz (3035 g) APGAR: 9, 9  Home with mother.  Roe Coombsachelle A Tevon Berhane, CNM 07/09/2015, 8:26 AM

## 2015-07-10 ENCOUNTER — Encounter: Payer: BC Managed Care – PPO | Admitting: Certified Nurse Midwife

## 2015-07-16 ENCOUNTER — Ambulatory Visit (HOSPITAL_COMMUNITY): Payer: Medicaid Other

## 2015-07-23 ENCOUNTER — Encounter: Payer: Self-pay | Admitting: Certified Nurse Midwife

## 2015-07-23 ENCOUNTER — Ambulatory Visit (INDEPENDENT_AMBULATORY_CARE_PROVIDER_SITE_OTHER): Payer: BC Managed Care – PPO | Admitting: Certified Nurse Midwife

## 2015-07-23 ENCOUNTER — Ambulatory Visit (HOSPITAL_COMMUNITY): Payer: Medicaid Other

## 2015-07-23 DIAGNOSIS — Z1389 Encounter for screening for other disorder: Secondary | ICD-10-CM

## 2015-07-23 NOTE — Progress Notes (Signed)
Patient ID: Carla Morrow, female   DOB: [birth year], 35 y.o.   MRN: [id number] Subjective:     Carla Morrow is a 35 y.o. female who presents for a postpartum visit. She is 2 weeks postpartum following a spontaneous vaginal delivery. I have fully reviewed the prenatal and intrapartum course. The delivery was at 39 gestational weeks. Outcome: spontaneous vaginal delivery. Anesthesia: none. Postpartum course has been normal. Baby's course has been normal. Baby is feeding by both breast and bottle - Similac Advance. Bleeding thin lochia, brown and red. Bowel function is normal. Bladder function is normal. Patient is not sexually active. Contraception method is abstinence. Postpartum depression screening: negative.  Tobacco, alcohol and substance abuse history reviewed.  Adult immunizations reviewed including TDAP, rubella and varicella.  The following portions of the patient's history were reviewed and updated as appropriate: allergies, current medications, past family history, past medical history, past social history, past surgical history and problem list.  Review of Systems Pertinent items noted in HPI and remainder of comprehensive ROS otherwise negative.   Objective:    BP 129/83 mmHg  Pulse 73  Wt 225 lb (102.059 kg)  General:  alert, cooperative and no distress   Breasts:  inspection negative, no nipple discharge or bleeding, no masses or nodularity palpable  Lungs: clear to auscultation bilaterally  Heart:  regular rate and rhythm, S1, S2 normal, no murmur, click, rub or gallop  Abdomen: soft, non-tender; bowel sounds normal; no masses,  no organomegaly   Vulva:  not evaluated  Vagina: not evaluated  Cervix:  not evaluated  Corpus: not examined  Adnexa:  not evaluated  Rectal Exam: Not performed.          50% of 15 min visit spent on counseling and coordination of care.  Assessment:     Normal 2 week postpartum exam. Pap smear not done at today's visit.  Plan:    1.  Contraception: abstinence 2.  Planning Mirena IUD with next appointment.   3. Follow up in: 4 weeks or as needed.  2hr GTT for h/o GDM/screening for DM q 3 yrs per ADA recommendations Preconception counseling provided Healthy lifestyle practices reviewed

## 2015-07-23 NOTE — Addendum Note (Signed)
Addended by: Samantha CrimesENNEY, Sarai January ANNE on: 07/23/2015 11:06 AM   Modules accepted: Orders

## 2015-08-20 ENCOUNTER — Ambulatory Visit: Payer: BC Managed Care – PPO | Admitting: Certified Nurse Midwife

## 2015-08-20 ENCOUNTER — Telehealth: Payer: Self-pay | Admitting: General Practice

## 2015-08-20 NOTE — Telephone Encounter (Signed)
Pt called stated to be bleeding longer that 6 weeks. Pt is not feeling weak or change female pad very often is just long bleeding over 6 weeks, pt has an appt on 07/19. In case that pt has a new symptoms we recommended to go to [facility] for evaluation. Pt sound to understanding instruction.    Carla Morrow

## 2015-08-28 ENCOUNTER — Encounter: Payer: Self-pay | Admitting: *Deleted

## 2015-08-28 ENCOUNTER — Encounter: Payer: Self-pay | Admitting: Certified Nurse Midwife

## 2015-08-28 ENCOUNTER — Ambulatory Visit (INDEPENDENT_AMBULATORY_CARE_PROVIDER_SITE_OTHER): Payer: BC Managed Care – PPO | Admitting: Certified Nurse Midwife

## 2015-08-28 VITALS — BP 158/99 | HR 65 | Temp 98.5°F | Resp 21 | Wt 215.0 lb

## 2015-08-28 DIAGNOSIS — Z30430 Encounter for insertion of intrauterine contraceptive device: Secondary | ICD-10-CM | POA: Insufficient documentation

## 2015-08-28 DIAGNOSIS — Z30014 Encounter for initial prescription of intrauterine contraceptive device: Secondary | ICD-10-CM | POA: Diagnosis not present

## 2015-08-28 DIAGNOSIS — Z3202 Encounter for pregnancy test, result negative: Secondary | ICD-10-CM | POA: Diagnosis not present

## 2015-08-28 DIAGNOSIS — Z01818 Encounter for other preprocedural examination: Secondary | ICD-10-CM

## 2015-08-28 LAB — POCT URINE PREGNANCY: PREG TEST UR: NEGATIVE

## 2015-08-28 NOTE — Progress Notes (Signed)
Patient ID: Carla CelesteChristina Morrow, female   DOB: [birth year], 35 y.o.   MRN: [id number]  IUD Procedure Note   DIAGNOSIS: Desires long-term, reversible contraception   PROCEDURE: IUD placement Performing Provider: Orvilla Cornwallachelle Tynlee Bayle CNM  Patient counseled prior to procedure. I explained risks and benefits of Mirena IUD, reviewed alternative forms of contraception. Patient stated understanding and consented to continue with procedure.   LMP: unknown, breastfeeding Pregnancy Test: Negative Lot #: [id number] Expiration Date: 02/20   IUD type: [X]  Mirena   [  ] Paraguard  [  ] Christean GriefSkyla   [   ]  Kyleena  PROCEDURE:  Timeout procedure was performed to ensure right patient and right site.  A bimanual exam was performed to determine the position of the uterus, retroverted. The speculum was placed. The vagina and cervix was sterilized in the usual manner and sterile technique was maintained throughout the course of the procedure. A single toothed tenaculum was not required. The depth of the uterus was sounded to 9 cm. The IUD was inserted to the appropriate depth and inserted without difficulty.  The string was cut to an estimated 4 cm length. Bleeding was minimal. The patient tolerated the procedure well.   Follow up: The patient tolerated the procedure well without complications.  Standard post-procedure care is explained and return precautions are given.  Orvilla Cornwallachelle Zaiyden Strozier CNM

## 2015-08-28 NOTE — Progress Notes (Signed)
Patient ID: Carla Morrow, female   DOB: [birth year], 35 y.o.   MRN: [id number]  Subjective:     Carla Morrow is a 35 y.o. female who presents for a postpartum visit. She is 6 weeks postpartum following a spontaneous vaginal delivery. I have fully reviewed the prenatal and intrapartum course. The delivery was at 39 gestational weeks. Outcome: spontaneous vaginal delivery. Anesthesia: none. Postpartum course has been normal. Baby's course has been normal. Baby is feeding by breast. Bleeding no bleeding. Bowel function is normal. Bladder function is normal. Patient is not sexually active. Contraception method is abstinence. Postpartum depression screening: negative.  Tobacco, alcohol and substance abuse history reviewed.  Adult immunizations reviewed including TDAP, rubella and varicella.  The following portions of the patient's history were reviewed and updated as appropriate: allergies, current medications, past family history, past medical history, past social history, past surgical history and problem list.  Review of Systems Pertinent items noted in HPI and remainder of comprehensive ROS otherwise negative.   Objective:    BP 158/99 mmHg  Pulse 65  Temp(Src) 98.5 F (36.9 C)  Resp 21  Wt 215 lb (97.523 kg)  LMP 08/14/2015 (Approximate)  Breastfeeding? Yes  General:  alert, cooperative, appears stated age and no distress   Breasts:  inspection negative, no nipple discharge or bleeding, no masses or nodularity palpable  Lungs: clear to auscultation bilaterally  Heart:  regular rate and rhythm, S1, S2 normal, no murmur, click, rub or gallop  Abdomen: soft, non-tender; bowel sounds normal; no masses,  no organomegaly   Vulva:  normal  Vagina: normal vagina, no discharge, exudate, lesion, or erythema  Cervix:  no cervical motion tenderness  Corpus: normal  Adnexa:  normal adnexa  Rectal Exam: Not performed.          50% of 30 min visit spent on counseling and coordination of  care.  Assessment:     Normal 6 week postpartum exam. Pap smear not done at today's visit.  Plan:    1. Contraception: abstinence 2. IUD placed today.  Return to work letter completed.  Blood pressure normotensive on recheck.  3. Follow up in: 1 month for string check, annual exam due in January 2018 or as needed.  2hr GTT for h/o GDM/screening for DM q 3 yrs per ADA recommendations Preconception counseling provided Healthy lifestyle practices reviewed

## 2015-09-04 LAB — NUSWAB VG+, CANDIDA 6SP
Candida albicans, NAA: NEGATIVE
Candida glabrata, NAA: NEGATIVE
Candida krusei, NAA: NEGATIVE
Candida lusitaniae, NAA: NEGATIVE
Candida parapsilosis, NAA: NEGATIVE
Candida tropicalis, NAA: NEGATIVE
Chlamydia trachomatis, NAA: NEGATIVE
Neisseria gonorrhoeae, NAA: NEGATIVE
Trich vag by NAA: NEGATIVE

## 2015-10-01 ENCOUNTER — Ambulatory Visit: Payer: BC Managed Care – PPO | Admitting: Certified Nurse Midwife

## 2015-10-03 ENCOUNTER — Encounter: Payer: Self-pay | Admitting: Certified Nurse Midwife

## 2015-10-03 ENCOUNTER — Ambulatory Visit (INDEPENDENT_AMBULATORY_CARE_PROVIDER_SITE_OTHER): Payer: BC Managed Care – PPO | Admitting: Certified Nurse Midwife

## 2015-10-03 VITALS — BP 138/87 | HR 66 | Wt 203.0 lb

## 2015-10-03 DIAGNOSIS — Z30431 Encounter for routine checking of intrauterine contraceptive device: Secondary | ICD-10-CM

## 2015-10-03 NOTE — Progress Notes (Signed)
Patient ID: Carla Morrow, female   DOB: 10-09-1980, 35 y.o.   MRN: [id number]  Chief Complaint  Patient presents with  . Follow-up    mirena check up    HPI Carla Morrow is a [age] y.o. female.  Here for IUD check up.  Has been having period like bleeding with off and on spotting since insertion.  Is otherwise happy with the IUD.   Currently working.  Both bottle and breast feeding.    HPI  Past Medical History:  Diagnosis Date  . Medical history non-contributory   . No pertinent past medical history     Past Surgical History:  Procedure Laterality Date  . NO PAST SURGERIES      Family History  Problem Relation Age of Onset  . Anuerysm Mother   . Hypertension Father     Social History Social History  Substance Use Topics  . Smoking status: Never Smoker  . Smokeless tobacco: Never Used  . Alcohol use No    No Known Allergies  Current Outpatient Prescriptions  Medication Sig Dispense Refill  . labetalol (NORMODYNE) 100 MG tablet Take 2 tablets (200 mg total) by mouth 2 (two) times daily. 60 tablet 1  . Prenat-FeAsp-Meth-FA-DHA w/o A (PRENATE PIXIE) 10-0.6-0.4-200 MG CAPS Take 1 tablet by mouth daily. 30 capsule 12   No current facility-administered medications for this visit.     Review of Systems Review of Systems Constitutional: negative for fatigue and weight loss Respiratory: negative for cough and wheezing Cardiovascular: negative for chest pain, fatigue and palpitations Gastrointestinal: negative for abdominal pain and change in bowel habits Genitourinary:negative Integument/breast: negative for nipple discharge Musculoskeletal:negative for myalgias Neurological: negative for gait problems and tremors Behavioral/Psych: negative for abusive relationship, depression Endocrine: negative for temperature intolerance     Blood pressure 138/87, pulse 66, weight 203 lb (92.1 kg), currently breastfeeding.  Physical Exam Physical Exam General:    alert  Skin:   no rash or abnormalities  Lungs:   clear to auscultation bilaterally  Heart:   regular rate and rhythm, S1, S2 normal, no murmur, click, rub or gallop  Breasts:   deferred  Abdomen:  normal findings: no organomegaly, soft, non-tender and no hernia  Pelvis:  External genitalia: normal general appearance Urinary system: urethral meatus normal and bladder without fullness, nontender Vaginal: normal without tenderness, induration or masses Cervix: normal appearance, strings visualized Adnexa: normal bimanual exam Uterus: anteverted and non-tender, normal size    50% of 15 min visit spent on counseling and coordination of care.   Data Reviewed Previous medical hx, meds, labs  Assessment     IUD check up: in place    Plan   Encouragement given for the normalcy of bleeding.  Patient verbalized understanding.   No orders of the defined types were placed in this encounter.  No orders of the defined types were placed in this encounter.    Follow up as needed or with annual exam after the start of 2018.

## 2016-01-10 ENCOUNTER — Telehealth: Payer: Self-pay | Admitting: *Deleted

## 2016-01-10 NOTE — Telephone Encounter (Signed)
Patient is calling to get referral to a primary care for management of her hypertension.

## 2016-01-13 ENCOUNTER — Other Ambulatory Visit: Payer: Self-pay | Admitting: Certified Nurse Midwife

## 2016-01-13 DIAGNOSIS — I10 Essential (primary) hypertension: Secondary | ICD-10-CM

## 2016-01-13 NOTE — Telephone Encounter (Signed)
I have placed a referral to internal medicine.  Thank you.  R.Regan Llorente CNM

## 2016-03-05 ENCOUNTER — Other Ambulatory Visit: Payer: Self-pay | Admitting: *Deleted

## 2016-03-05 ENCOUNTER — Telehealth: Payer: Self-pay | Admitting: *Deleted

## 2016-03-05 DIAGNOSIS — N760 Acute vaginitis: Principal | ICD-10-CM

## 2016-03-05 DIAGNOSIS — B9689 Other specified bacterial agents as the cause of diseases classified elsewhere: Secondary | ICD-10-CM

## 2016-03-05 MED ORDER — METRONIDAZOLE 500 MG PO TABS: 500.0000 mg | ORAL_TABLET | Freq: Two times a day (BID) | ORAL | 0 refills | Status: DC

## 2016-03-05 NOTE — Progress Notes (Signed)
Pt called to office with d/c and odor. Pt would like to know if related to Mirena. Pt advised not known to be associated with IUD's. Pt was treated per protocol for BV.  Pt advised she is due for AEX at any time. Pt advised if symptoms do not improve with treatment to call and make appt. Pt states understanding.

## 2016-03-05 NOTE — Telephone Encounter (Signed)
Spoke with pt regarding d/c and Mirena.  See order note.

## 2016-03-05 NOTE — Telephone Encounter (Signed)
Pt called to office stating she is having change in d/c, ? Mirena related.  Attempt to contact pt. No answer, LM on VM to call office.

## 2016-03-18 ENCOUNTER — Other Ambulatory Visit: Payer: Self-pay

## 2016-03-18 DIAGNOSIS — B373 Candidiasis of vulva and vagina: Secondary | ICD-10-CM

## 2016-03-18 DIAGNOSIS — B3731 Acute candidiasis of vulva and vagina: Secondary | ICD-10-CM

## 2016-03-18 MED ORDER — FLUCONAZOLE 150 MG PO TABS: 150.0000 mg | ORAL_TABLET | Freq: Once | ORAL | 0 refills | Status: AC

## 2016-03-18 NOTE — Progress Notes (Signed)
Recent Flagyl use c/o possible yeast infection with vaginal itching.

## 2016-03-19 ENCOUNTER — Other Ambulatory Visit (HOSPITAL_COMMUNITY)
Admission: RE | Admit: 2016-03-19 | Discharge: 2016-03-19 | Disposition: A | Discharge status: Home / Self Care | Payer: BC Managed Care – PPO | Source: Ambulatory Visit | Attending: Obstetrics | Admitting: Obstetrics

## 2016-03-19 ENCOUNTER — Encounter: Payer: Self-pay | Admitting: Obstetrics

## 2016-03-19 ENCOUNTER — Ambulatory Visit (INDEPENDENT_AMBULATORY_CARE_PROVIDER_SITE_OTHER): Payer: BC Managed Care – PPO | Admitting: Obstetrics

## 2016-03-19 VITALS — BP 141/101 | HR 87 | Wt 194.0 lb

## 2016-03-19 DIAGNOSIS — N898 Other specified noninflammatory disorders of vagina: Secondary | ICD-10-CM | POA: Insufficient documentation

## 2016-03-19 DIAGNOSIS — T8332XA Displacement of intrauterine contraceptive device, initial encounter: Secondary | ICD-10-CM

## 2016-03-19 LAB — POCT URINALYSIS DIPSTICK
Blood, UA: NEGATIVE
Glucose, UA: NEGATIVE
Ketones, UA: NEGATIVE
Nitrite, UA: NEGATIVE
PROTEIN UA: NEGATIVE
Spec Grav, UA: <=1.005
Urobilinogen, UA: NEGATIVE
pH, UA: 6.5

## 2016-03-19 NOTE — Progress Notes (Signed)
Patient ID: Carla Morrow, female   DOB: 10-Aug-1980, 36 y.o.   MRN: [id number]  Chief Complaint  Patient presents with  . Gynecologic Exam    HPI Carla Morrow is a 36 y.o. female.  Vaginal itching and burning. HPI  Past Medical History:  Diagnosis Date  . Medical history non-contributory   . No pertinent past medical history     Past Surgical History:  Procedure Laterality Date  . NO PAST SURGERIES      Family History  Problem Relation Age of Onset  . Anuerysm Mother   . Hypertension Father     Social History Social History  Substance Use Topics  . Smoking status: Never Smoker  . Smokeless tobacco: Never Used  . Alcohol use No    No Known Allergies  Current Outpatient Prescriptions  Medication Sig Dispense Refill  . levonorgestrel (MIRENA) 20 MCG/24HR IUD 1 each by Intrauterine route once.    . labetalol (NORMODYNE) 100 MG tablet Take 2 tablets (200 mg total) by mouth 2 (two) times daily. (Patient not taking: Reported on 03/19/2016) 60 tablet 1  . Prenat-FeAsp-Meth-FA-DHA w/o A (PRENATE PIXIE) 10-0.6-0.4-200 MG CAPS Take 1 tablet by mouth daily. (Patient not taking: Reported on 03/19/2016) 30 capsule 12   No current facility-administered medications for this visit.     Review of Systems Review of Systems Constitutional: negative for fatigue and weight loss Respiratory: negative for cough and wheezing Cardiovascular: negative for chest pain, fatigue and palpitations Gastrointestinal: negative for abdominal pain and change in bowel habits Genitourinary:positive for vaginal itching and burning Integument/breast: negative for nipple discharge Musculoskeletal:negative for myalgias Neurological: negative for gait problems and tremors Behavioral/Psych: negative for abusive relationship, depression Endocrine: negative for temperature intolerance      Blood pressure (!) 141/101, pulse 87, weight 194 lb (88 kg), last menstrual period 02/19/2016, not currently  breastfeeding.  Physical Exam Physical Exam General:   alert  Skin:   no rash or abnormalities  Lungs:   clear to auscultation bilaterally  Heart:   regular rate and rhythm, S1, S2 normal, no murmur, click, rub or gallop  Breasts:   normal without suspicious masses, skin or nipple changes or axillary nodes  Abdomen:  normal findings: no organomegaly, soft, non-tender and no hernia  Pelvis:  External genitalia: normal general appearance Urinary system: urethral meatus normal and bladder without fullness, nontender Vaginal: normal without tenderness, induration or masses Cervix: normal appearance Adnexa: normal bimanual exam Uterus: anteverted and non-tender, normal size    50% of 15 min visit spent on counseling and coordination of care.    Data Reviewed Previous office notes  Assessment     Vaginitis IUD strings lost Hypertension.  Followed by PCP.    Plan    Wet prep and cultures done Ultrasound ordered for IUD placement F/U in 2 weeks  Orders Placed This Encounter  Procedures  . US Transvaginal Non-OB    Standing Status:   Future    Standing Expiration Date:   05/17/2017    Order Specific Question:   Reason for Exam (SYMPTOM  OR DIAGNOSIS REQUIRED)    Answer:   IUD Placement    Order Specific Question:   Preferred imaging location?    Answer:   [facility]  . US Pelvis Complete    Standing Status:   Future    Standing Expiration Date:   05/17/2017    Order Specific Question:   Reason for Exam (SYMPTOM  OR DIAGNOSIS REQUIRED)    Answer:  IUD Placement    Order Specific Question:   Preferred imaging location?    [facility]  . POCT urinalysis dipstick   Meds ordered this encounter  Medications  . levonorgestrel (MIRENA) 20 MCG/24HR IUD    Sig: 1 each by Intrauterine route once.         Patient ID: Carla Morrow, female   DOB: 18-Mar-1980, 36 y.o.   MRN: [id number]

## 2016-03-19 NOTE — Progress Notes (Signed)
Patient is burning and itching- she used BV treatment about a week ago and then was treated for yeast. Her symptoms are not better.  Patient is having burning with urination.

## 2016-03-19 NOTE — Addendum Note (Signed)
Addended by: Elby BeckPAUL, Latonja Bobeck F on: 03/19/2016 03:10 PM   Modules accepted: Orders

## 2016-03-20 ENCOUNTER — Other Ambulatory Visit: Payer: Self-pay | Admitting: Obstetrics

## 2016-03-20 DIAGNOSIS — B373 Candidiasis of vulva and vagina: Secondary | ICD-10-CM

## 2016-03-20 DIAGNOSIS — B9689 Other specified bacterial agents as the cause of diseases classified elsewhere: Secondary | ICD-10-CM

## 2016-03-20 DIAGNOSIS — N760 Acute vaginitis: Principal | ICD-10-CM

## 2016-03-20 DIAGNOSIS — B3731 Acute candidiasis of vulva and vagina: Secondary | ICD-10-CM

## 2016-03-20 LAB — CERVICOVAGINAL ANCILLARY ONLY
BACTERIAL VAGINITIS: POSITIVE — AB
CANDIDA VAGINITIS: POSITIVE — AB
CHLAMYDIA, DNA PROBE: NEGATIVE
NEISSERIA GONORRHEA: NEGATIVE
Trichomonas: NEGATIVE

## 2016-03-20 MED ORDER — FLUCONAZOLE 150 MG PO TABS: 150.0000 mg | ORAL_TABLET | Freq: Once | ORAL | 0 refills | Status: DC

## 2016-03-20 MED ORDER — TINIDAZOLE 500 MG PO TABS: 1000.0000 mg | ORAL_TABLET | Freq: Every day | ORAL | 2 refills | Status: DC

## 2016-03-21 LAB — URINE CULTURE

## 2016-03-26 ENCOUNTER — Ambulatory Visit (HOSPITAL_COMMUNITY): Payer: Medicaid Other

## 2016-03-27 ENCOUNTER — Ambulatory Visit (HOSPITAL_COMMUNITY)
Admission: RE | Admit: 2016-03-27 | Discharge: 2016-03-27 | Disposition: A | Discharge status: Home / Self Care | Payer: BC Managed Care – PPO | Source: Ambulatory Visit | Attending: Obstetrics | Admitting: Obstetrics

## 2016-03-27 DIAGNOSIS — T8332XA Displacement of intrauterine contraceptive device, initial encounter: Secondary | ICD-10-CM | POA: Insufficient documentation

## 2016-03-27 DIAGNOSIS — X58XXXA Exposure to other specified factors, initial encounter: Secondary | ICD-10-CM | POA: Insufficient documentation

## 2016-04-01 ENCOUNTER — Telehealth: Payer: Self-pay

## 2016-04-01 ENCOUNTER — Other Ambulatory Visit: Payer: Self-pay | Admitting: Obstetrics

## 2016-04-01 DIAGNOSIS — Z30011 Encounter for initial prescription of contraceptive pills: Secondary | ICD-10-CM

## 2016-04-01 NOTE — Telephone Encounter (Signed)
Provider returned call and spoke with patient about the results.

## 2016-04-01 NOTE — Telephone Encounter (Signed)
Confirmed pharmacy with patient so that provider can send rx.

## 2016-04-01 NOTE — Telephone Encounter (Signed)
Returned call, patient wanted results from U/S on 03-27-16, advised that I would route to the provider for further review and we would follow up with her.

## 2016-04-07 ENCOUNTER — Telehealth: Payer: Self-pay | Admitting: *Deleted

## 2016-04-07 ENCOUNTER — Other Ambulatory Visit: Payer: Self-pay | Admitting: Obstetrics

## 2016-04-07 DIAGNOSIS — Z30011 Encounter for initial prescription of contraceptive pills: Secondary | ICD-10-CM

## 2016-04-07 MED ORDER — NORETHINDRONE-ETH ESTRADIOL 1-35 MG-MCG PO TABS: 1.0000 | ORAL_TABLET | Freq: Every day | ORAL | 11 refills | Status: DC

## 2016-04-07 NOTE — Telephone Encounter (Signed)
Patient is calling to remind provider that she needs her birth control sent to [facility]. Please do that for patient.

## 2016-07-01 ENCOUNTER — Telehealth: Payer: Self-pay

## 2016-07-01 ENCOUNTER — Other Ambulatory Visit: Payer: Self-pay | Admitting: Obstetrics

## 2016-07-01 DIAGNOSIS — B373 Candidiasis of vulva and vagina: Secondary | ICD-10-CM

## 2016-07-01 DIAGNOSIS — B3731 Acute candidiasis of vulva and vagina: Secondary | ICD-10-CM

## 2016-07-01 MED ORDER — FLUCONAZOLE 150 MG PO TABS: ORAL_TABLET | ORAL | 0 refills | Status: DC

## 2016-07-01 NOTE — Telephone Encounter (Signed)
Pt called requesting refill for diflucan. rx has been sent to the pharmacy per protocol. Pt informed

## 2017-03-09 ENCOUNTER — Other Ambulatory Visit: Payer: Self-pay | Admitting: Obstetrics

## 2017-03-09 DIAGNOSIS — Z30011 Encounter for initial prescription of contraceptive pills: Secondary | ICD-10-CM

## 2017-03-10 ENCOUNTER — Telehealth: Payer: Self-pay | Admitting: Pediatrics

## 2017-03-10 NOTE — Telephone Encounter (Signed)
Pt called requesting rx for metronidazole.  I called patient back, LMTCB

## 2017-03-15 NOTE — Telephone Encounter (Signed)
Additional call to patient regarding concern and Rx request  Pt not ava left detailed message if pt still needed assistance etc to contact the office.

## 2017-06-07 ENCOUNTER — Other Ambulatory Visit: Payer: Self-pay

## 2017-06-07 DIAGNOSIS — N898 Other specified noninflammatory disorders of vagina: Secondary | ICD-10-CM

## 2017-06-07 MED ORDER — METRONIDAZOLE 500 MG PO TABS: 500.0000 mg | ORAL_TABLET | Freq: Two times a day (BID) | ORAL | 0 refills | Status: DC

## 2017-06-07 NOTE — Progress Notes (Signed)
Rx sent per protocol for BV. Pt notified.

## 2017-08-23 ENCOUNTER — Other Ambulatory Visit: Payer: Self-pay | Admitting: Obstetrics

## 2017-08-23 DIAGNOSIS — Z30011 Encounter for initial prescription of contraceptive pills: Secondary | ICD-10-CM

## 2017-08-23 DIAGNOSIS — N898 Other specified noninflammatory disorders of vagina: Secondary | ICD-10-CM

## 2017-12-20 IMAGING — US US MFM OB FOLLOW-UP
1 series · 12 of 28 positions shown · non-contrast
Comparison: none

[Series 1: us mfm ob follow-up · 12 of 35 slices shown]
[im 2/35]
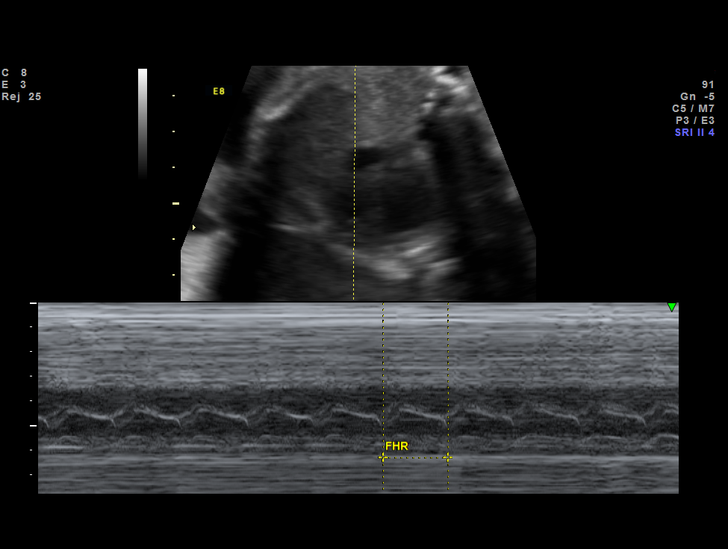
[im 4/35]
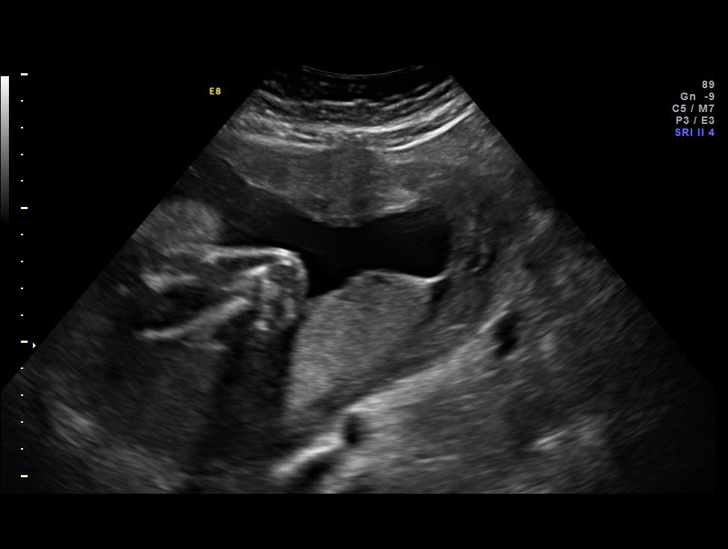
[im 7/35]
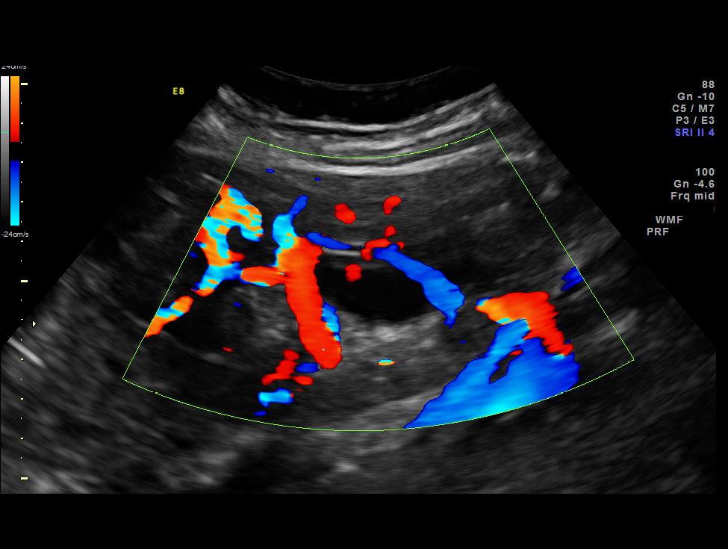
[im 11/35]
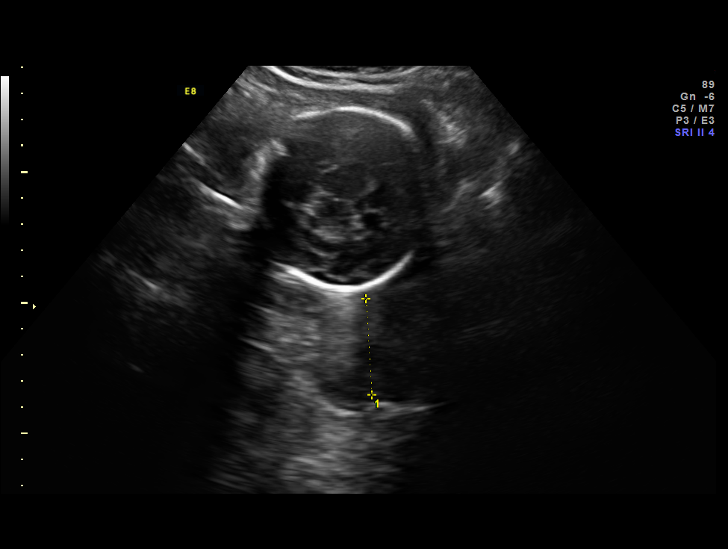
[im 13/35]
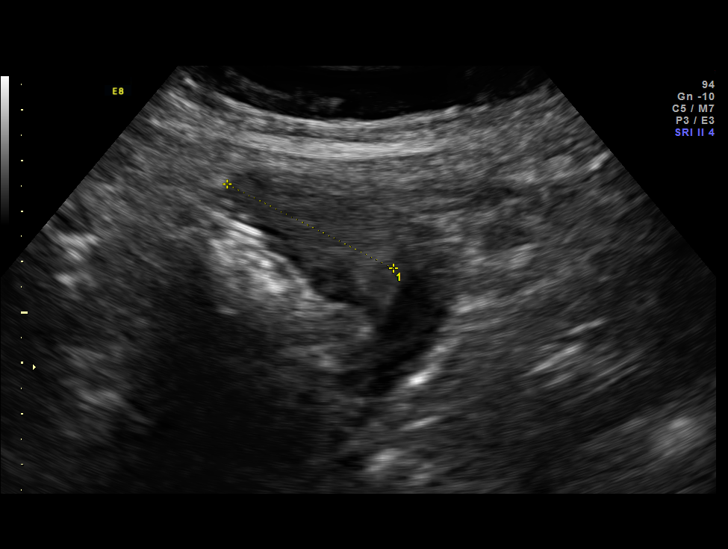
[im 16/35]
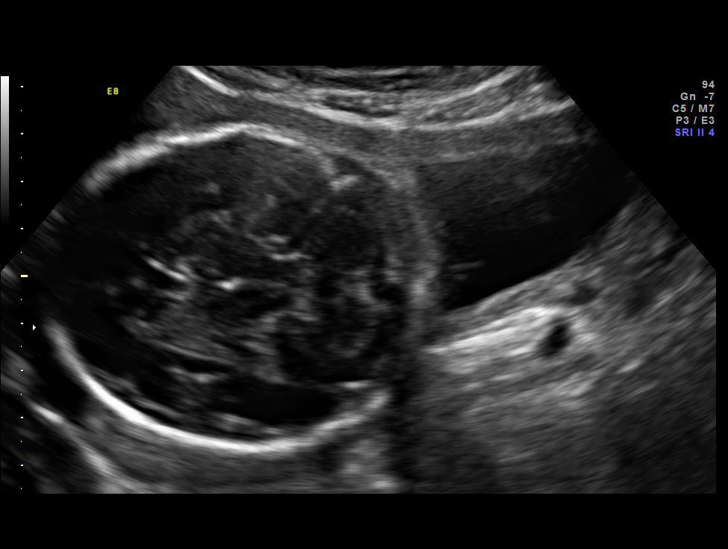
[im 19/35]
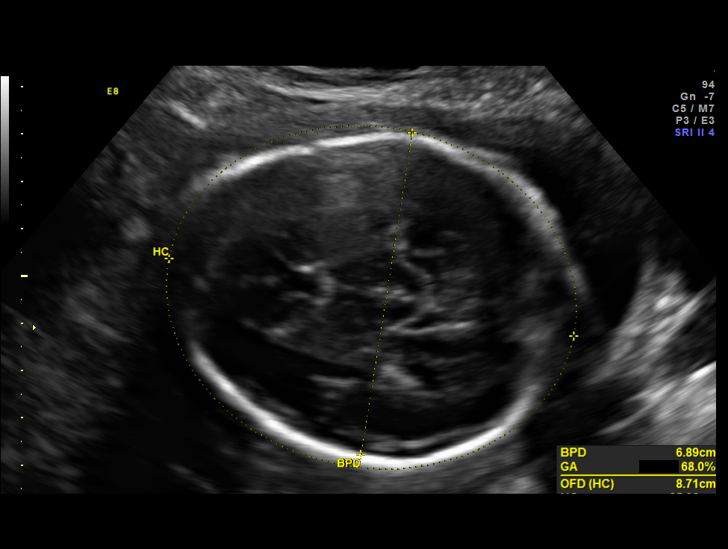
[im 22/35]
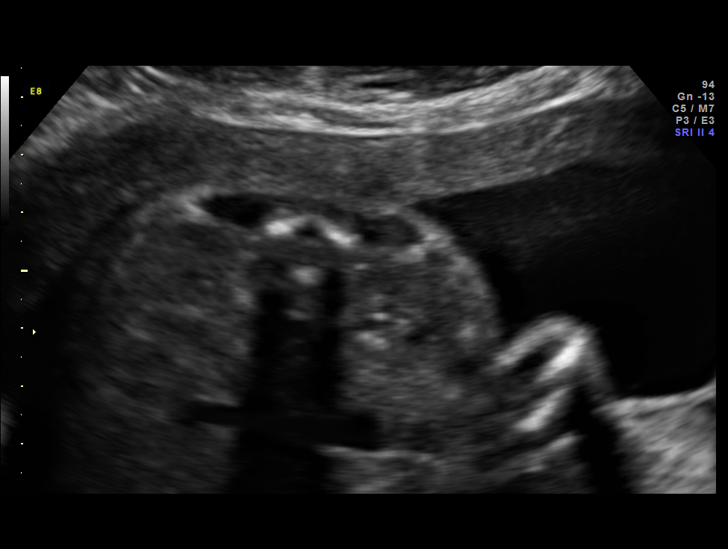
[im 24/35]
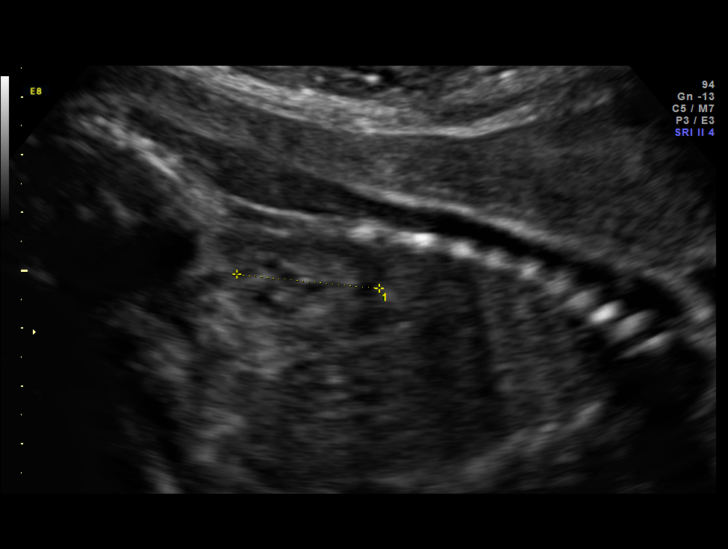
[im 28/35]
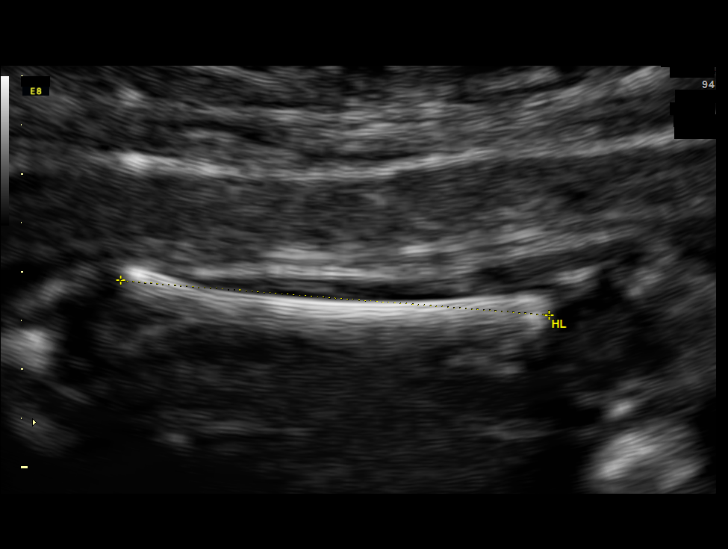
[im 31/35]
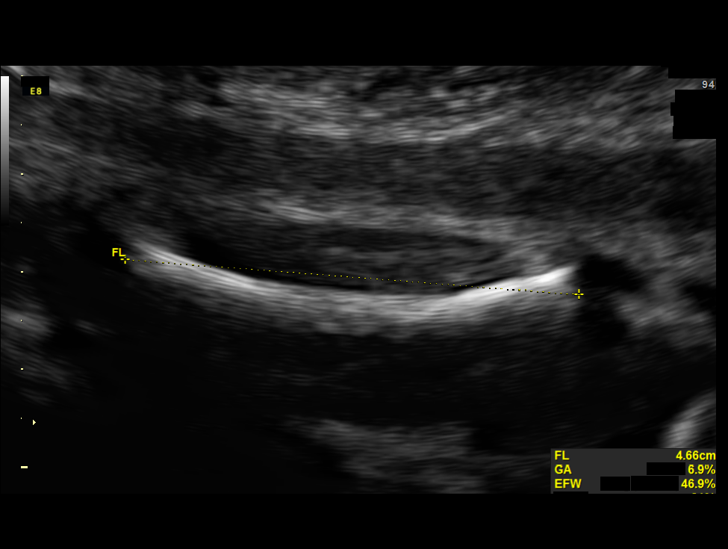
[im 33/35]
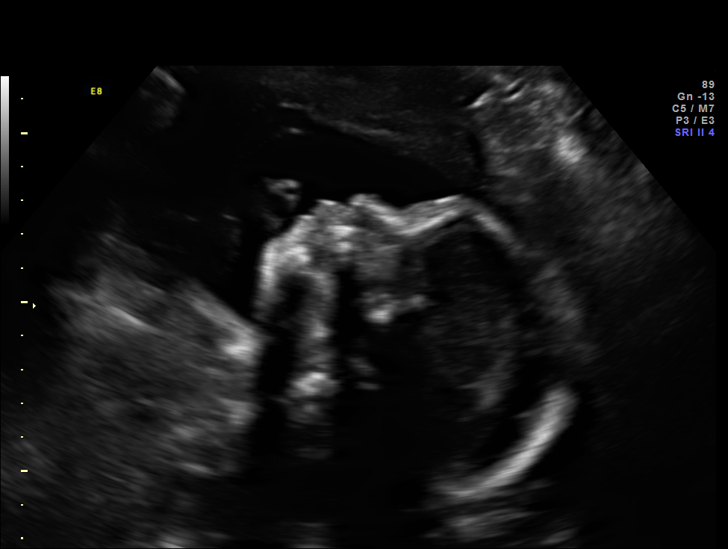

[12 of 28 positions shown; findings below may reference images not displayed]

Road [HOSPITAL]

[address]  [address]            [id number]      [id number]     [id number]
Indications

26 weeks gestation of pregnancy
Velamentous insertion of umbilical cord
Hypertension - Chronic/Pre-existing (no
meds); ASA
Poor obstetric history: Previous preterm
delivery (36 weeks); 17P
OB History

Gravidity:    4         Term:   2        Prem:   1        SAB:   0
TOP:          0       Ectopic:  0        Living: 3
Fetal Evaluation

Num Of Fetuses:     1
Fetal Heart         155
Rate(bpm):
Cardiac Activity:   Observed
Placenta:           Posterior right, above cervical os
P. Cord Insertion:  Anterior Velamentous insertion

Amniotic Fluid
AFI FV:      Subjectively within normal limits
Larg Pckt:     5.1  cm
Biometry

BPD:      68.7  mm     G. Age:  27w 4d                  CI:         78.2   %    70 - 86
OFD:      87.8  mm                                      FL/HC:      18.7   %    18.6 -
HC:      251.2  mm     G. Age:  27w 2d         37  %    HC/AC:      1.11        1.05 -
AC:      226.4  mm     G. Age:  27w 0d         47  %    FL/BPD:     68.4   %    71 - 87
FL:         47  mm     G. Age:  25w 5d          9  %    FL/AC:      20.8   %    20 - 24
HUM:      44.2  mm     G. Age:  26w 2d         33  %
CER:      31.7  mm     G. Age:  27w 5d         64  %
CM:        3.2  mm

Est. FW:     960  gm      2 lb 2 oz     48  %
Gestational Age

LMP:           26w 6d        Date:  10/20/14                 EDD:   07/27/15
U/S Today:     26w 6d                                        EDD:   07/27/15
Best:          26w 6d     Det. By:  LMP  (10/20/14)          EDD:   07/27/15
Anatomy

Cranium:          Appears normal         Aortic Arch:      Previously seen
Fetal Cavum:      Appears normal         Ductal Arch:      Previously seen
Ventricles:       Appears normal         Diaphragm:        Previously seen
Choroid Plexus:   Previously seen        Stomach:          Appears normal, left
sided
Cerebellum:       Appears normal         Abdomen:          Appears normal
Posterior Fossa:  Appears normal         Abdominal Wall:   Previously seen
Nuchal Fold:      Not applicable (>20    Cord Vessels:     Previously seen
wks GA)
Face:             Orbits and profile     Kidneys:          Appear normal
previously seen
Lips:             Previously seen        Bladder:          Appears normal
Fetal Thoracic:   Appears normal         Spine:            Previously seen
Heart:            Appears normal         Upper             Previously seen
(4CH, axis, and        Extremities:
situs)
RVOT:             Previously seen        Lower             Previously seen
Extremities:
LVOT:             Previously seen

Other:  Fetus appears to be a male. Heels and 5th digit previously seen.
Technically difficult due to fetal position.
Cervix Uterus Adnexa

Cervix
Length:            3.6  cm.
Normal appearance by transabdominal scan.

Left Ovary
Within normal limits.

Right Ovary
Within normal limits.

Adnexa:       No abnormality visualized.
Impression

Single IUP at 26w 6d
Chronic hypertension, velamentous cord insertion
Normal interval anatomy
Fetal growth is appropriate (48th %tile)
Posterior, right placenta. A velamentous placenta cord
insertion is again noted.
Normal amniotic fluid volume
Recommendations

Recommend follow-up ultrasound examination in 4 weeks for
interval growth due to chronic hypertension

## 2018-02-14 ENCOUNTER — Other Ambulatory Visit: Payer: Self-pay | Admitting: Obstetrics

## 2018-02-14 DIAGNOSIS — Z30011 Encounter for initial prescription of contraceptive pills: Secondary | ICD-10-CM

## 2018-03-09 ENCOUNTER — Ambulatory Visit: Payer: BC Managed Care – PPO | Admitting: Obstetrics

## 2018-03-24 ENCOUNTER — Encounter: Payer: Self-pay | Admitting: Obstetrics

## 2018-03-24 ENCOUNTER — Ambulatory Visit (INDEPENDENT_AMBULATORY_CARE_PROVIDER_SITE_OTHER): Payer: BC Managed Care – PPO | Admitting: Obstetrics

## 2018-03-24 ENCOUNTER — Other Ambulatory Visit (HOSPITAL_COMMUNITY)
Admission: RE | Admit: 2018-03-24 | Discharge: 2018-03-24 | Disposition: A | Discharge status: Home / Self Care | Payer: BC Managed Care – PPO | Source: Ambulatory Visit | Attending: Obstetrics | Admitting: Obstetrics

## 2018-03-24 VITALS — BP 132/80 | HR 72 | Ht 66.0 in | Wt 204.0 lb

## 2018-03-24 DIAGNOSIS — N898 Other specified noninflammatory disorders of vagina: Secondary | ICD-10-CM

## 2018-03-24 DIAGNOSIS — Z113 Encounter for screening for infections with a predominantly sexual mode of transmission: Secondary | ICD-10-CM

## 2018-03-24 DIAGNOSIS — Z01419 Encounter for gynecological examination (general) (routine) without abnormal findings: Secondary | ICD-10-CM | POA: Insufficient documentation

## 2018-03-24 NOTE — Progress Notes (Signed)
Pt presents for annual, pap, and all STD testing today.

## 2018-03-25 ENCOUNTER — Encounter: Payer: Self-pay | Admitting: Obstetrics

## 2018-03-25 LAB — CERVICOVAGINAL ANCILLARY ONLY
BACTERIAL VAGINITIS: NEGATIVE
CANDIDA VAGINITIS: NEGATIVE
Chlamydia: NEGATIVE
NEISSERIA GONORRHEA: NEGATIVE
TRICH (WINDOWPATH): NEGATIVE

## 2018-03-25 LAB — HIV ANTIBODY (ROUTINE TESTING W REFLEX): HIV Screen 4th Generation wRfx: NONREACTIVE

## 2018-03-25 LAB — HEPATITIS B SURFACE ANTIGEN: HEP B S AG: NEGATIVE

## 2018-03-25 LAB — RPR: RPR Ser Ql: NONREACTIVE

## 2018-03-25 LAB — HEPATITIS C ANTIBODY: Hep C Virus Ab: <0.1 s/co ratio (ref 0.0–0.9)

## 2018-03-25 NOTE — Progress Notes (Signed)
Subjective:        Carla Morrow is a 38 y.o. female here for a routine exam.  Current complaints: None.    Personal health questionnaire:  Is patient Ashkenazi Jewish, have a family history of breast and/or ovarian cancer: no Is there a family history of uterine cancer diagnosed at age < 6, gastrointestinal cancer, urinary tract cancer, family member who is a Personnel officer syndrome-associated carrier: no Is the patient overweight and hypertensive, family history of diabetes, personal history of gestational diabetes, preeclampsia or PCOS: no Is patient over 71, have PCOS,  family history of premature CHD under age 7, diabetes, smoke, have hypertension or peripheral artery disease:  no At any time, has a partner hit, kicked or otherwise hurt or frightened you?: no Over the past 2 weeks, have you felt down, depressed or hopeless?: no Over the past 2 weeks, have you felt little interest or pleasure in doing things?:no   Gynecologic History Patient's last menstrual period was 03/03/2018 (within days). Contraception: OCP (estrogen/progesterone) Last Pap: 2015. Results were: ASCUS with negative High Risk HPV Last mammogram: n/a. Results were: n/a  Obstetric History OB History  Gravida Para Term Preterm AB Living  4 4 3 1  0 4  SAB TAB Ectopic Multiple Live Births  0 0 0 0 4    # Outcome Date GA Lbr Len/2nd Weight Sex Delivery Anes PTL Lv  4 Term 07/07/15 [redacted]w[redacted]d 08:45 6 lb 11.1 oz (3.035 kg) M Vag-Spont None  LIV  3 Preterm 09/27/11 [redacted]w[redacted]d 08:26 / 00:11 5 lb 15.1 oz (2.696 kg) M Vag-Spont None  LIV     Birth Comments: WNL  2 Term 03/24/03 [redacted]w[redacted]d  6 lb 12 oz (3.062 kg) F Vag-Spont EPI N LIV  1 Term 04/20/97 [redacted]w[redacted]d  7 lb 8 oz (3.402 kg) F Vag-Spont EPI N LIV    Past Medical History:  Diagnosis Date  . Medical history non-contributory   . No pertinent past medical history     Past Surgical History:  Procedure Laterality Date  . NO PAST SURGERIES       Current Outpatient  Medications:  .  amLODipine (NORVASC) 5 MG tablet, TK 1 T PO QD, Disp: , Rfl:  .  NORTREL 1/35, 28, tablet, TAKE 1 TABLET BY MOUTH DAILY, Disp: 28 tablet, Rfl: 11 .  Prenat-FeAsp-Meth-FA-DHA w/o A (PRENATE PIXIE) 10-0.6-0.4-200 MG CAPS, Take 1 tablet by mouth daily., Disp: 30 capsule, Rfl: 12 .  fluconazole (DIFLUCAN) 150 MG tablet, take 1 tablet by mouth as a single dose (Patient not taking: Reported on 03/24/2018), Disp: 1 tablet, Rfl: 0 .  labetalol (NORMODYNE) 100 MG tablet, Take 2 tablets (200 mg total) by mouth 2 (two) times daily. (Patient not taking: Reported on 03/19/2016), Disp: 60 tablet, Rfl: 1 .  levonorgestrel (MIRENA) 20 MCG/24HR IUD, 1 each by Intrauterine route once., Disp: , Rfl:  .  metroNIDAZOLE (FLAGYL) 500 MG tablet, TAKE 1 TABLET(500 MG) BY MOUTH TWICE DAILY (Patient not taking: Reported on 03/24/2018), Disp: 14 tablet, Rfl: 0 .  tinidazole (TINDAMAX) 500 MG tablet, Take 2 tablets (1,000 mg total) by mouth daily with breakfast. (Patient not taking: Reported on 03/24/2018), Disp: 10 tablet, Rfl: 2 No Known Allergies  Social History   Tobacco Use  . Smoking status: Never Smoker  . Smokeless tobacco: Never Used  Substance Use Topics  . Alcohol use: No    Alcohol/week: 0.0 standard drinks    Family History  Problem Relation Age of Onset  . Anuerysm  Mother   . Hypertension Father       Review of Systems  Constitutional: negative for fatigue and weight loss Respiratory: negative for cough and wheezing Cardiovascular: negative for chest pain, fatigue and palpitations Gastrointestinal: negative for abdominal pain and change in bowel habits Musculoskeletal:negative for myalgias Neurological: negative for gait problems and tremors Behavioral/Psych: negative for abusive relationship, depression Endocrine: negative for temperature intolerance    Genitourinary:negative for abnormal menstrual periods, genital lesions, hot flashes, sexual problems and vaginal  discharge Integument/breast: negative for breast lump, breast tenderness, nipple discharge and skin lesion(s)    Objective:       BP 132/80   Pulse 72   Ht 5\' 6"  (1.676 m)   Wt 204 lb (92.5 kg)   LMP 03/03/2018 (Within Days)   BMI 32.93 kg/m  General:   alert  Skin:   no rash or abnormalities  Lungs:   clear to auscultation bilaterally  Heart:   regular rate and rhythm, S1, S2 normal, no murmur, click, rub or gallop  Breasts:   normal without suspicious masses, skin or nipple changes or axillary nodes  Abdomen:  normal findings: no organomegaly, soft, non-tender and no hernia  Pelvis:  External genitalia: normal general appearance Urinary system: urethral meatus normal and bladder without fullness, nontender Vaginal: normal without tenderness, induration or masses Cervix: normal appearance Adnexa: normal bimanual exam Uterus: anteverted and non-tender, normal size   Lab Review Urine pregnancy test Labs reviewed yes Radiologic studies reviewed no  50% of 20 min visit spent on counseling and coordination of care.   Assessment:    Healthy female exam.    Plan:    Education reviewed: calcium supplements, depression evaluation, low fat, low cholesterol diet, safe sex/STD prevention, self breast exams and weight bearing exercise. Contraception: OCP (estrogen/progesterone). Follow up in: 1 year.   No orders of the defined types were placed in this encounter.  Orders Placed This Encounter  Procedures  . Hepatitis B surface antigen  . Hepatitis C antibody  . HIV Antibody (routine testing w rflx)  . RPR    Brock Bad MD 03-24-2018

## 2018-03-29 LAB — CYTOLOGY - PAP
DIAGNOSIS: NEGATIVE
HPV (WINDOPATH): NOT DETECTED

## 2018-11-21 IMAGING — US US PELVIS COMPLETE
1 series · 15 of 25 positions shown · non-contrast
Comparison: None

CLINICAL DATA: IUD strings not visualized



[Series 1: us pelvis complete · 15 of 88 slices shown]
[im 1/88]
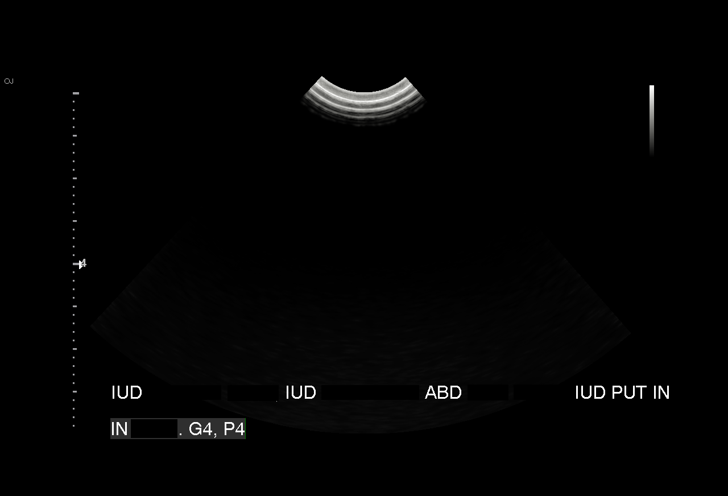
[im 8/88]
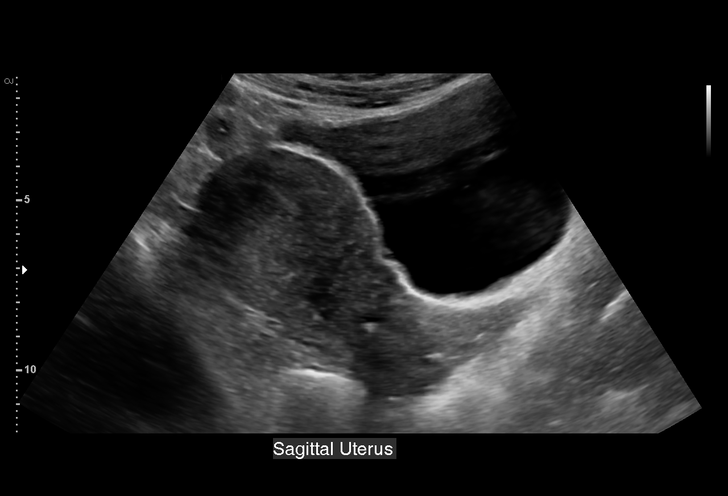
[im 15/88]
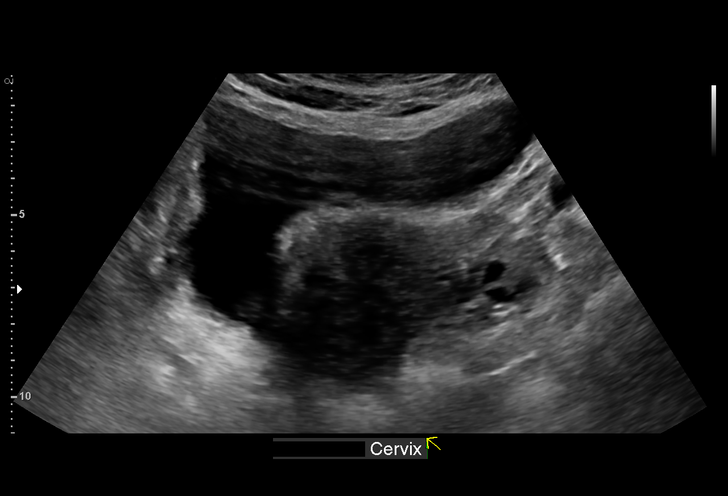
[im 19/88]
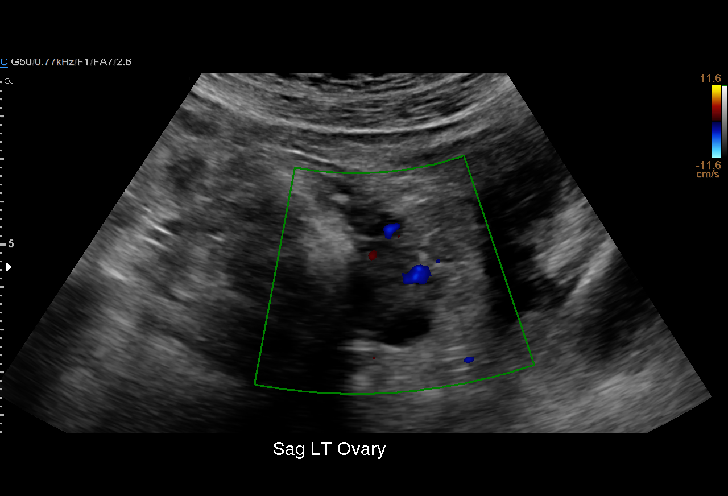
[im 26/88]
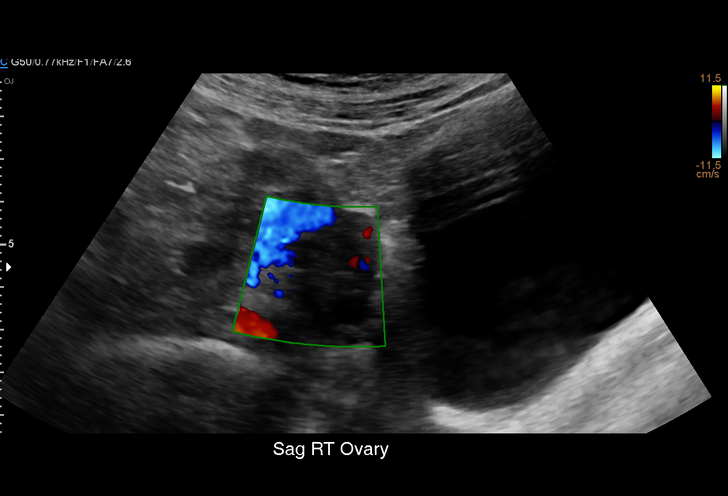
[im 33/88]
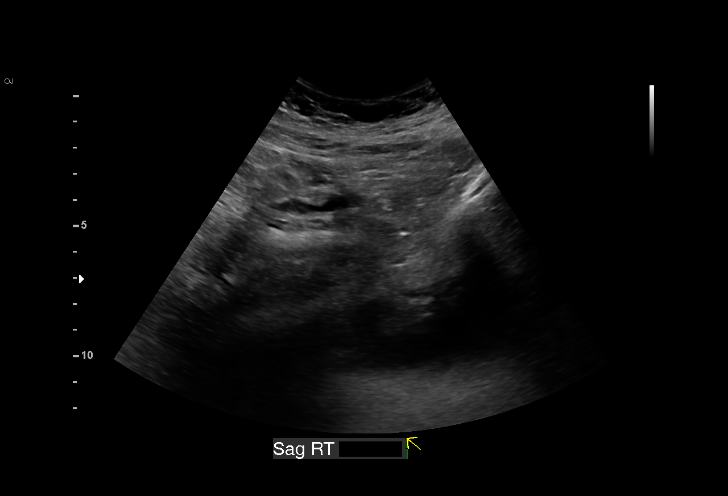
[im 37/88]
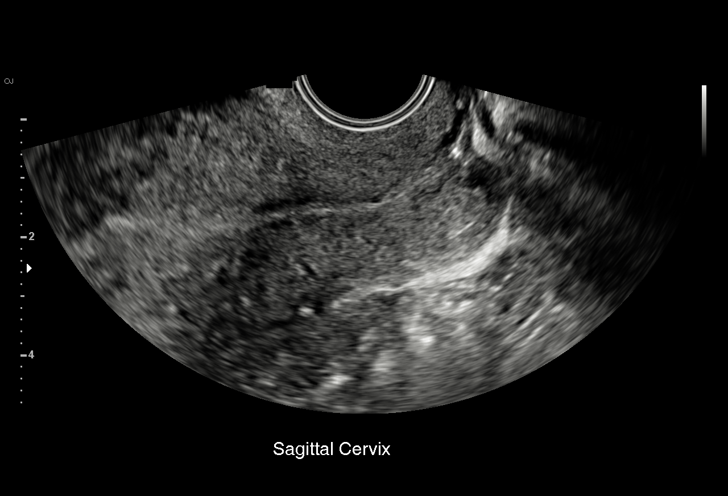
[im 44/88]
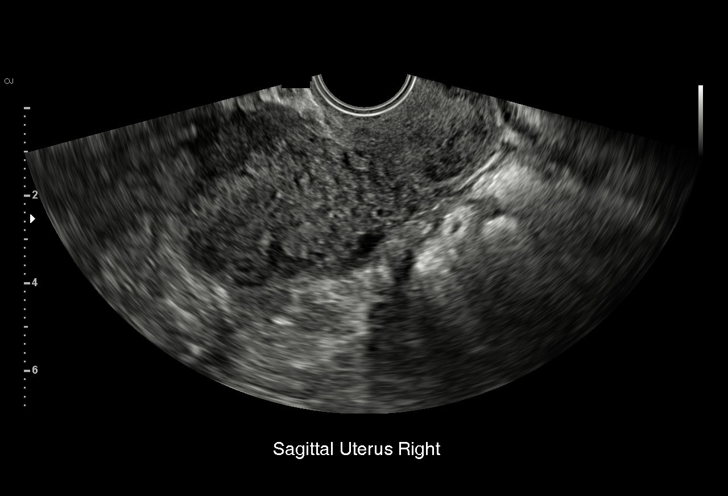
[im 51/88]
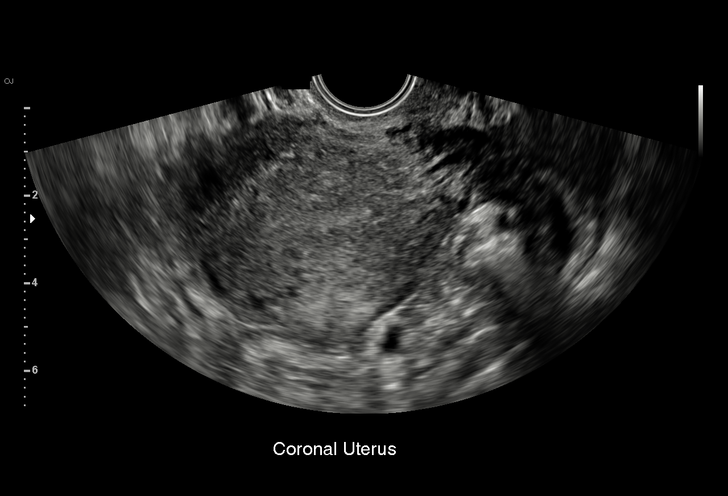
[im 55/88]
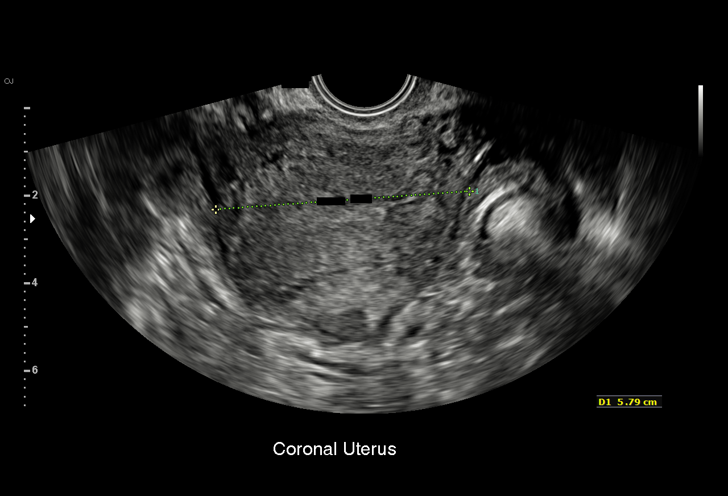
[im 62/88]
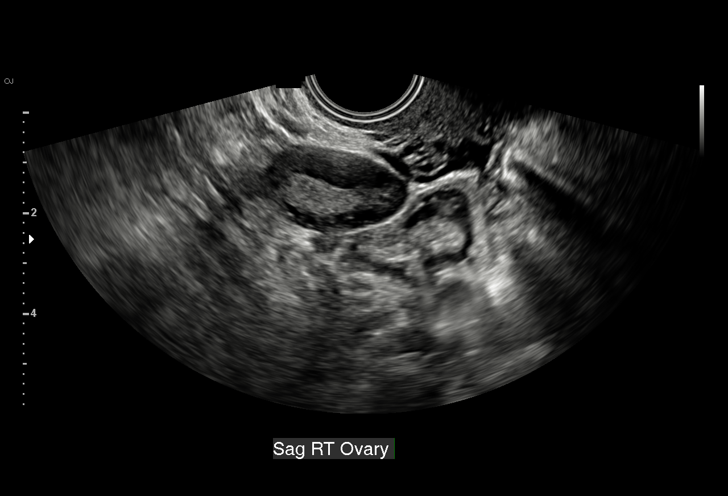
[im 69/88]
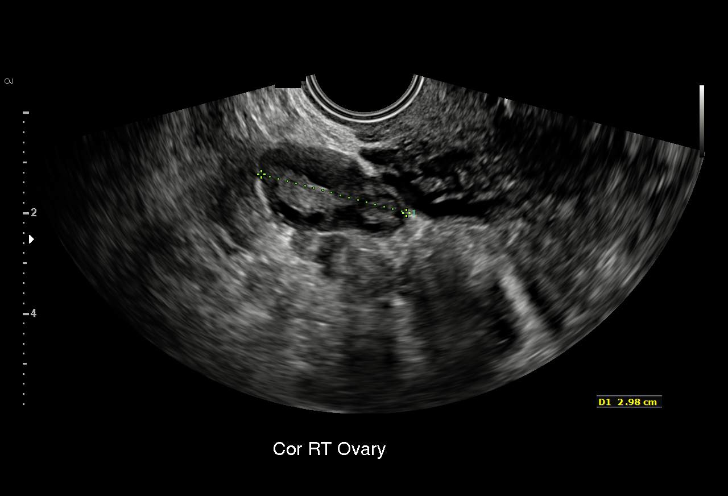
[im 73/88]
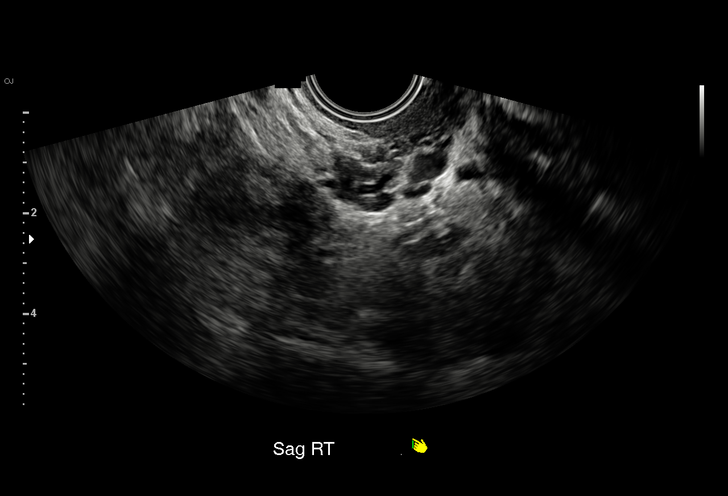
[im 80/88]
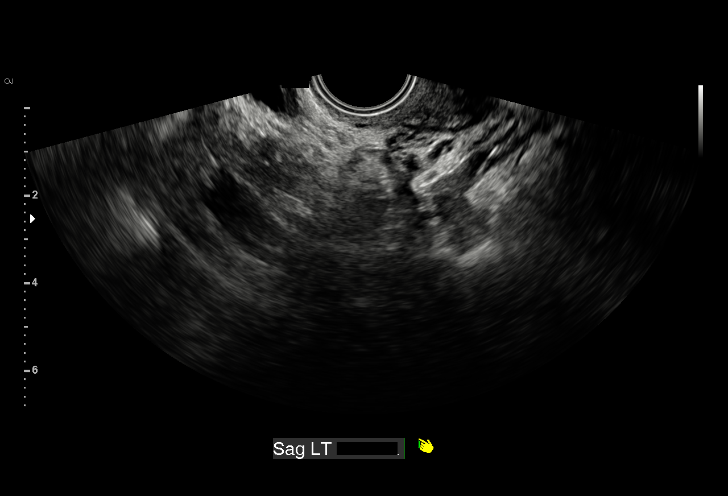
[im 88/88]
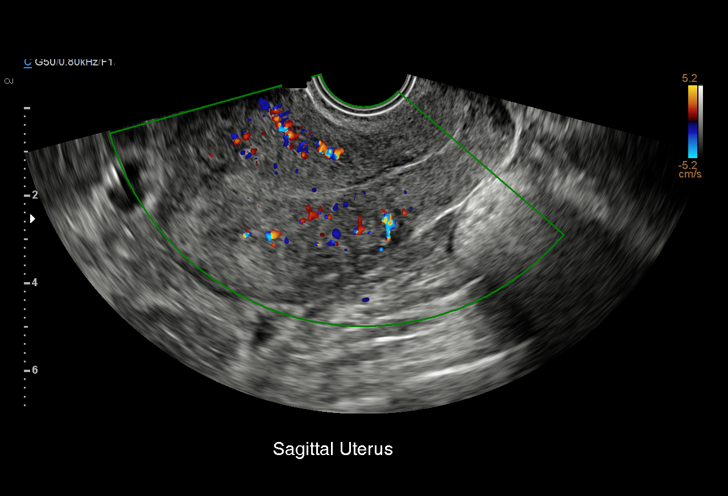

[15 of 25 positions shown; findings below may reference images not displayed]

FINDINGS: Uterus

Measurements: 9.2 x 4.9 x 6.1 cm. No fibroids or other mass
visualized.

Endometrium

Thickness: 4 mm in thickness. IUD not visualized within the
endometrium

Right ovary

Measurements: 3.0 x 1.6 x 3.0 cm. Normal appearance/no adnexal mass.

Left ovary

Measurements: 2.7 x 2.6 x 2.1 cm. Normal appearance/no adnexal mass.

Other findings

No abnormal free fluid.
IMPRESSION: IUD not visualized within the endometrium.

No acute findings.

## 2019-01-18 ENCOUNTER — Other Ambulatory Visit: Payer: Self-pay

## 2019-01-18 DIAGNOSIS — Z30011 Encounter for initial prescription of contraceptive pills: Secondary | ICD-10-CM

## 2019-01-18 MED ORDER — NORTREL 1/35 (28) 1-35 MG-MCG PO TABS: 1.0000 | ORAL_TABLET | Freq: Every day | ORAL | 3 refills | Status: DC

## 2019-01-18 NOTE — Progress Notes (Signed)
Contraceptive pill refill for 3 months to get patient through until she is due for annual. Rx sent, pt made aware and verbalizes understanding.

## 2019-01-30 ENCOUNTER — Telehealth: Payer: Self-pay

## 2019-01-30 NOTE — Telephone Encounter (Signed)
Pt called regarding lower abdominal pain and cloudy urine.  TC to pt to make aware to make nurse visit pt was not ava LVM  Scheduling made aware pt needs appt.

## 2019-02-06 ENCOUNTER — Ambulatory Visit: Payer: BC Managed Care – PPO | Admitting: *Deleted

## 2019-02-06 ENCOUNTER — Other Ambulatory Visit: Payer: Self-pay

## 2019-02-06 DIAGNOSIS — R399 Unspecified symptoms and signs involving the genitourinary system: Secondary | ICD-10-CM

## 2019-02-06 NOTE — Progress Notes (Signed)
SUBJECTIVE: Carla Morrow is a 38 y.o. female who complains of lower pelvic discomfort and cloudy urine x 7 days, without flank pain, fever, chills, or abnormal vaginal discharge or bleeding. Pt denies any symptoms while urinating.   OBJECTIVE: Appears well, in no apparent distress.  Urine dipstick shows positive for RBC's.    ASSESSMENT: lower pelvic pain  PLAN: UC to be sent today per protocol.  Pt made aware will be treated once resulted if needed.  Call or return to clinic prn if these symptoms worsen or fail to improve as anticipated.

## 2019-02-08 LAB — URINE CULTURE

## 2019-02-14 ENCOUNTER — Ambulatory Visit: Payer: BC Managed Care – PPO | Attending: Internal Medicine

## 2019-02-14 DIAGNOSIS — Z20822 Contact with and (suspected) exposure to covid-19: Secondary | ICD-10-CM

## 2019-02-16 LAB — NOVEL CORONAVIRUS, NAA: SARS-CoV-2, NAA: NOT DETECTED

## 2019-03-13 ENCOUNTER — Other Ambulatory Visit: Payer: Self-pay

## 2019-03-13 MED ORDER — FLUCONAZOLE 150 MG PO TABS: 150.0000 mg | ORAL_TABLET | Freq: Once | ORAL | 0 refills | Status: AC

## 2019-03-13 NOTE — Progress Notes (Signed)
Rx diflucan sent per protocol for pt with symptoms of white discharge and itching. Pt made aware and advised to call back if symptoms do not improve in 48 hours, pt voices understanding.

## 2019-03-17 ENCOUNTER — Telehealth: Payer: Self-pay

## 2019-03-17 NOTE — Telephone Encounter (Signed)
Patient called and left message on voicemail stating. I returned patient call and she states that she has been experiencing vaginal itching and irritation that started about a week ago.she denies having any vaginal discharge. Patient is requesting to be seen. She states that the diflucan that was sent is not helping her sx. Patient transferred to front desk to schedule appt.

## 2019-03-20 ENCOUNTER — Encounter: Payer: Self-pay | Admitting: Obstetrics

## 2019-03-20 ENCOUNTER — Other Ambulatory Visit: Payer: Self-pay

## 2019-03-20 ENCOUNTER — Ambulatory Visit (INDEPENDENT_AMBULATORY_CARE_PROVIDER_SITE_OTHER): Payer: BC Managed Care – PPO | Admitting: Obstetrics

## 2019-03-20 ENCOUNTER — Other Ambulatory Visit (HOSPITAL_COMMUNITY)
Admission: RE | Admit: 2019-03-20 | Discharge: 2019-03-20 | Disposition: A | Discharge status: Home / Self Care | Payer: BC Managed Care – PPO | Source: Ambulatory Visit | Attending: Obstetrics | Admitting: Obstetrics

## 2019-03-20 VITALS — BP 148/100 | HR 85 | Wt 217.0 lb

## 2019-03-20 DIAGNOSIS — N898 Other specified noninflammatory disorders of vagina: Secondary | ICD-10-CM | POA: Insufficient documentation

## 2019-03-20 DIAGNOSIS — Z3009 Encounter for other general counseling and advice on contraception: Secondary | ICD-10-CM

## 2019-03-20 DIAGNOSIS — B373 Candidiasis of vulva and vagina: Secondary | ICD-10-CM

## 2019-03-20 DIAGNOSIS — I10 Essential (primary) hypertension: Secondary | ICD-10-CM

## 2019-03-20 DIAGNOSIS — B3731 Acute candidiasis of vulva and vagina: Secondary | ICD-10-CM

## 2019-03-20 DIAGNOSIS — R110 Nausea: Secondary | ICD-10-CM | POA: Diagnosis not present

## 2019-03-20 DIAGNOSIS — Z3041 Encounter for surveillance of contraceptive pills: Secondary | ICD-10-CM

## 2019-03-20 DIAGNOSIS — Z3202 Encounter for pregnancy test, result negative: Secondary | ICD-10-CM | POA: Diagnosis not present

## 2019-03-20 LAB — POCT URINE PREGNANCY: Preg Test, Ur: NEGATIVE

## 2019-03-20 MED ORDER — FLUCONAZOLE 150 MG PO TABS: ORAL_TABLET | ORAL | 0 refills | Status: DC

## 2019-03-20 NOTE — Progress Notes (Signed)
Patient ID: Carla Morrow, female   DOB: [birth year], 39 y.o.   MRN: [id number]  Chief Complaint  Patient presents with  . Vaginal Discharge    HPI Carla Morrow is a 39 y.o. female. Complains of nausea. HPI  Past Medical History:  Diagnosis Date  . Medical history non-contributory   . No pertinent past medical history     Past Surgical History:  Procedure Laterality Date  . NO PAST SURGERIES      Family History  Problem Relation Age of Onset  . Anuerysm Mother   . Hypertension Father     Social History Social History   Tobacco Use  . Smoking status: Never Smoker  . Smokeless tobacco: Never Used  Substance Use Topics  . Alcohol use: No    Alcohol/week: 0.0 standard drinks  . Drug use: No    No Known Allergies  Current Outpatient Medications  Medication Sig Dispense Refill  . amLODipine (NORVASC) 5 MG tablet TK 1 T PO QD    . fluconazole (DIFLUCAN) 150 MG tablet take 1 tablet by mouth as a single dose 1 tablet 0  . labetalol (NORMODYNE) 100 MG tablet Take 2 tablets (200 mg total) by mouth 2 (two) times daily. (Patient not taking: Reported on 03/19/2016) 60 tablet 1  . levonorgestrel (MIRENA) 20 MCG/24HR IUD 1 each by Intrauterine route once.    . metroNIDAZOLE (FLAGYL) 500 MG tablet TAKE 1 TABLET(500 MG) BY MOUTH TWICE DAILY (Patient not taking: Reported on 03/24/2018) 14 tablet 0  . Prenat-FeAsp-Meth-FA-DHA w/o A (PRENATE PIXIE) 10-0.6-0.4-200 MG CAPS Take 1 tablet by mouth daily. 30 capsule 12  . tinidazole (TINDAMAX) 500 MG tablet Take 2 tablets (1,000 mg total) by mouth daily with breakfast. (Patient not taking: Reported on 03/24/2018) 10 tablet 2   No current facility-administered medications for this visit.    Review of Systems Review of Systems Constitutional: negative for fatigue and weight loss Respiratory: negative for cough and wheezing Cardiovascular: negative for chest pain, fatigue and palpitations Gastrointestinal: positive for nausea.  negative for abdominal pain and change in bowel habits Genitourinary:negative Integument/breast: negative for nipple discharge Musculoskeletal:negative for myalgias Neurological: negative for gait problems and tremors Behavioral/Psych: negative for abusive relationship, depression Endocrine: negative for temperature intolerance      Blood pressure (!) 148/100, pulse 85, weight 217 lb (98.4 kg), last menstrual period 03/08/2019.  Physical Exam Physical Exam General:   alert  Skin:   no rash or abnormalities  Lungs:   clear to auscultation bilaterally  Heart:   regular rate and rhythm, S1, S2 normal, no murmur, click, rub or gallop  Breasts:   normal without suspicious masses, skin or nipple changes or axillary nodes  Abdomen:  normal findings: no organomegaly, soft, non-tender and no hernia  Pelvis:  External genitalia: normal general appearance Urinary system: urethral meatus normal and bladder without fullness, nontender Vaginal: normal without tenderness, induration or masses Cervix: normal appearance Adnexa: normal bimanual exam Uterus: anteverted and non-tender, normal size    50% of 15 min visit spent on counseling and coordination of care.   Data Reviewed Wet Prep  Assessment     1. Vaginal irritation Rx: - Cervicovaginal ancillary only( [facility])  2. Candida vaginitis Rx: - fluconazole (DIFLUCAN) 150 MG tablet; take 1 tablet by mouth as a single dose  Dispense: 1 tablet; Refill: 0  3. Nausea Rx: - POCT urine pregnancy  4. HTN (hypertension), benign - uncontrolled.  Managed by PCP  5. Encounter for surveillance of  contraceptive pills - not a good candidate for OCP with uncontrolled BP  6. Encounter for other general counseling and advice on contraception - ParaGuard IUD recommended because of BP      Plan  OCP's discontinued   Follow up for ParaGuard IUD insertion with menses.  All questions answered to patient's satisfaction regarding recommendation  for change in birth control, and patient agrees.  Orders Placed This Encounter  Procedures  . POCT urine pregnancy   Meds ordered this encounter  Medications  . fluconazole (DIFLUCAN) 150 MG tablet    Sig: take 1 tablet by mouth as a single dose    Dispense:  1 tablet    Refill:  0     Brock Bad, MD 03/20/2019 4:27 PM

## 2019-03-20 NOTE — Progress Notes (Signed)
RGYN patient presents for problem visit today. LMP:   CC: Vaginal Irritation pt complains of nausea.  Pt request UPT due to nausea.

## 2019-03-22 LAB — CERVICOVAGINAL ANCILLARY ONLY
Bacterial Vaginitis (gardnerella): POSITIVE — AB
Candida Glabrata: NEGATIVE
Candida Vaginitis: NEGATIVE
Chlamydia: NEGATIVE
Comment: NEGATIVE
Comment: NEGATIVE
Comment: NEGATIVE
Comment: NEGATIVE
Comment: NEGATIVE
Comment: NORMAL
Neisseria Gonorrhea: NEGATIVE
Trichomonas: NEGATIVE

## 2019-03-23 ENCOUNTER — Other Ambulatory Visit: Payer: Self-pay | Admitting: Obstetrics

## 2019-03-23 DIAGNOSIS — B9689 Other specified bacterial agents as the cause of diseases classified elsewhere: Secondary | ICD-10-CM

## 2019-03-23 MED ORDER — TINIDAZOLE 500 MG PO TABS: 1000.0000 mg | ORAL_TABLET | Freq: Every day | ORAL | 2 refills | Status: DC

## 2019-03-28 ENCOUNTER — Ambulatory Visit (INDEPENDENT_AMBULATORY_CARE_PROVIDER_SITE_OTHER): Payer: BC Managed Care – PPO | Admitting: Obstetrics

## 2019-03-28 ENCOUNTER — Encounter: Payer: Self-pay | Admitting: Obstetrics

## 2019-03-28 ENCOUNTER — Other Ambulatory Visit (HOSPITAL_COMMUNITY)
Admission: RE | Admit: 2019-03-28 | Discharge: 2019-03-28 | Disposition: A | Discharge status: Home / Self Care | Payer: BC Managed Care – PPO | Source: Ambulatory Visit | Attending: Obstetrics | Admitting: Obstetrics

## 2019-03-28 ENCOUNTER — Other Ambulatory Visit: Payer: Self-pay

## 2019-03-28 VITALS — BP 133/79 | HR 79 | Wt 221.0 lb

## 2019-03-28 DIAGNOSIS — N898 Other specified noninflammatory disorders of vagina: Secondary | ICD-10-CM | POA: Insufficient documentation

## 2019-03-28 DIAGNOSIS — Z3041 Encounter for surveillance of contraceptive pills: Secondary | ICD-10-CM

## 2019-03-28 DIAGNOSIS — E669 Obesity, unspecified: Secondary | ICD-10-CM

## 2019-03-28 DIAGNOSIS — Z01419 Encounter for gynecological examination (general) (routine) without abnormal findings: Secondary | ICD-10-CM | POA: Insufficient documentation

## 2019-03-28 MED ORDER — NORTREL 1/35 (28) 1-35 MG-MCG PO TABS: 1.0000 | ORAL_TABLET | Freq: Every day | ORAL | 4 refills | Status: DC

## 2019-03-28 NOTE — Progress Notes (Signed)
Subjective:        Carla Morrow is a 39 y.o. female here for a routine exam.  Current complaints: None.    Personal health questionnaire:  Is patient Ashkenazi Jewish, have a family history of breast and/or ovarian cancer: no Is there a family history of uterine cancer diagnosed at age < 55, gastrointestinal cancer, urinary tract cancer, family member who is a Field seismologist syndrome-associated carrier: no Is the patient overweight and hypertensive, family history of diabetes, personal history of gestational diabetes, preeclampsia or PCOS: no Is patient over 56, have PCOS,  family history of premature CHD under age 50, diabetes, smoke, have hypertension or peripheral artery disease:  no At any time, has a partner hit, kicked or otherwise hurt or frightened you?: no Over the past 2 weeks, have you felt down, depressed or hopeless?: no Over the past 2 weeks, have you felt little interest or pleasure in doing things?:no   Gynecologic History Patient's last menstrual period was 03/06/2019. Contraception: OCP (estrogen/progesterone) Last Pap: 03-24-2018. Results were: normal Last mammogram: n/a. Results were: n/a  Obstetric History OB History  Gravida Para Term Preterm AB Living  4 4 3 1  0 4  SAB TAB Ectopic Multiple Live Births  0 0 0 0 4    # Outcome Date GA Lbr Len/2nd Weight Sex Delivery Anes PTL Lv  4 Term 07/07/15 [redacted]w[redacted]d 08:45 6 lb 11.1 oz (3.035 kg) M Vag-Spont None  LIV  3 Preterm 09/27/11 [redacted]w[redacted]d 08:26 / 00:11 5 lb 15.1 oz (2.696 kg) M Vag-Spont None  LIV     Birth Comments: WNL  2 Term 03/24/03 [redacted]w[redacted]d  6 lb 12 oz (3.062 kg) F Vag-Spont EPI N LIV  1 Term 04/20/97 [redacted]w[redacted]d  7 lb 8 oz (3.402 kg) F Vag-Spont EPI N LIV    Past Medical History:  Diagnosis Date  . Medical history non-contributory   . No pertinent past medical history     Past Surgical History:  Procedure Laterality Date  . NO PAST SURGERIES       Current Outpatient Medications:  .  amLODipine (NORVASC) 5 MG  tablet, TK 1 T PO QD, Disp: , Rfl:  .  fluconazole (DIFLUCAN) 150 MG tablet, take 1 tablet by mouth as a single dose, Disp: 1 tablet, Rfl: 0 .  labetalol (NORMODYNE) 100 MG tablet, Take 2 tablets (200 mg total) by mouth 2 (two) times daily. (Patient not taking: Reported on 03/19/2016), Disp: 60 tablet, Rfl: 1 .  metroNIDAZOLE (FLAGYL) 500 MG tablet, TAKE 1 TABLET(500 MG) BY MOUTH TWICE DAILY (Patient not taking: Reported on 03/24/2018), Disp: 14 tablet, Rfl: 0 .  norethindrone-ethinyl estradiol 1/35 (NORTREL 1/35, 28,) tablet, Take 1 tablet by mouth daily., Disp: 84 tablet, Rfl: 4 .  Prenat-FeAsp-Meth-FA-DHA w/o A (PRENATE PIXIE) 10-0.6-0.4-200 MG CAPS, Take 1 tablet by mouth daily., Disp: 30 capsule, Rfl: 12 .  tinidazole (TINDAMAX) 500 MG tablet, Take 2 tablets (1,000 mg total) by mouth daily with breakfast., Disp: 10 tablet, Rfl: 2 No Known Allergies  Social History   Tobacco Use  . Smoking status: Never Smoker  . Smokeless tobacco: Never Used  Substance Use Topics  . Alcohol use: No    Alcohol/week: 0.0 standard drinks    Family History  Problem Relation Age of Onset  . Anuerysm Mother   . Hypertension Father       Review of Systems  Constitutional: negative for fatigue and weight loss Respiratory: negative for cough and wheezing Cardiovascular: negative for chest  pain, fatigue and palpitations Gastrointestinal: negative for abdominal pain and change in bowel habits Musculoskeletal:negative for myalgias Neurological: negative for gait problems and tremors Behavioral/Psych: negative for abusive relationship, depression Endocrine: negative for temperature intolerance    Genitourinary:negative for abnormal menstrual periods, genital lesions, hot flashes, sexual problems and vaginal discharge Integument/breast: negative for breast lump, breast tenderness, nipple discharge and skin lesion(s)    Objective:       BP 133/79   Pulse 79   Wt 221 lb (100.2 kg)   LMP 03/06/2019  Comment: end of Jan per pt   BMI 35.67 kg/m  General:   alert  Skin:   no rash or abnormalities  Lungs:   clear to auscultation bilaterally  Heart:   regular rate and rhythm, S1, S2 normal, no murmur, click, rub or gallop  Breasts:   normal without suspicious masses, skin or nipple changes or axillary nodes  Abdomen:  normal findings: no organomegaly, soft, non-tender and no hernia  Pelvis:  External genitalia: normal general appearance Urinary system: urethral meatus normal and bladder without fullness, nontender Vaginal: normal without tenderness, induration or masses Cervix: normal appearance Adnexa: normal bimanual exam Uterus: anteverted and non-tender, normal size   Lab Review Urine pregnancy test Labs reviewed yes Radiologic studies reviewed no  50% of 25 min visit spent on counseling and coordination of care.   Assessment:     1. Encounter for gynecological examination with Papanicolaou smear of cervix Rx: - Cytology - PAP  2. Vaginal discharge Rx: - Cervicovaginal ancillary only  3. Encounter for surveillance of contraceptive pills Rx: - norethindrone-ethinyl estradiol 1/35 (NORTREL 1/35, 28,) tablet; Take 1 tablet by mouth daily.  Dispense: 84 tablet; Refill: 4  4. Obesity (BMI 35.0-39.9 without comorbidity) - program of caloric restriction, exercise and behavioral modification recommended    Plan:    Education reviewed: calcium supplements, depression evaluation, low fat, low cholesterol diet, safe sex/STD prevention, self breast exams and weight bearing exercise. Contraception: OCP (estrogen/progesterone). Follow up in: 1 year.   Meds ordered this encounter  Medications  . norethindrone-ethinyl estradiol 1/35 (NORTREL 1/35, 28,) tablet    Sig: Take 1 tablet by mouth daily.    Dispense:  84 tablet    Refill:  4   No orders of the defined types were placed in this encounter.   Brock Bad, MD 03/28/2019 4:07 PM

## 2019-03-29 LAB — CERVICOVAGINAL ANCILLARY ONLY
Bacterial Vaginitis (gardnerella): NEGATIVE
Candida Glabrata: NEGATIVE
Candida Vaginitis: NEGATIVE
Chlamydia: NEGATIVE
Comment: NEGATIVE
Comment: NEGATIVE
Comment: NEGATIVE
Comment: NEGATIVE
Comment: NEGATIVE
Comment: NORMAL
Neisseria Gonorrhea: NEGATIVE
Trichomonas: NEGATIVE

## 2019-03-31 LAB — CYTOLOGY - PAP
Comment: NEGATIVE
Diagnosis: NEGATIVE
High risk HPV: NEGATIVE

## 2019-10-19 ENCOUNTER — Other Ambulatory Visit: Payer: BC Managed Care – PPO

## 2019-10-20 ENCOUNTER — Other Ambulatory Visit: Payer: Self-pay

## 2020-01-22 ENCOUNTER — Telehealth: Payer: Self-pay

## 2020-01-22 NOTE — Telephone Encounter (Signed)
Returned call, pt having frequent urination, pelvic pain, and nausea. Pt thinks she may have a UTI, advised scheduler will call for appointment, pt agreed.

## 2020-01-23 ENCOUNTER — Other Ambulatory Visit: Payer: Self-pay

## 2020-01-23 ENCOUNTER — Ambulatory Visit (INDEPENDENT_AMBULATORY_CARE_PROVIDER_SITE_OTHER): Payer: BC Managed Care – PPO

## 2020-01-23 VITALS — Temp 97.4°F

## 2020-01-23 DIAGNOSIS — R350 Frequency of micturition: Secondary | ICD-10-CM

## 2020-01-23 DIAGNOSIS — R1030 Lower abdominal pain, unspecified: Secondary | ICD-10-CM

## 2020-01-23 LAB — POCT URINALYSIS DIPSTICK
Bilirubin, UA: NEGATIVE
Blood, UA: NEGATIVE
Glucose, UA: NEGATIVE
Ketones, UA: NEGATIVE
Leukocytes, UA: NEGATIVE
Nitrite, UA: NEGATIVE
Odor: NEGATIVE
Protein, UA: NEGATIVE
Spec Grav, UA: 1.015 (ref 1.010–1.025)
Urobilinogen, UA: 0.2 E.U./dL
pH, UA: 7 (ref 5.0–8.0)

## 2020-01-23 LAB — POCT URINE PREGNANCY: Preg Test, Ur: NEGATIVE

## 2020-01-23 NOTE — Progress Notes (Addendum)
SUBJECTIVE: Carla Morrow is a 39 y.o. female who complains of urinary frequency, low abdominal pain, nausea, and fatigue x few days  OBJECTIVE: Appears well, in no apparent distress.  Urine dipstick shows negative for all components.    ASSESSMENT: Urinary freq, low abdominal pain  UPT - negative UA - negative  PLAN: Treatment after the provider reviews the results. Call or return to clinic prn if these symptoms worsen or fail to improve as anticipated.

## 2020-01-24 NOTE — Progress Notes (Signed)
Patient was assessed and managed by nursing staff during this encounter. I have reviewed the chart and agree with the documentation and plan. I have also made any necessary editorial changes.  Zettie Gootee, MD 01/24/2020 8:19 AM 

## 2020-01-25 LAB — URINE CULTURE

## 2020-05-29 ENCOUNTER — Other Ambulatory Visit: Payer: Self-pay | Admitting: *Deleted

## 2020-05-29 DIAGNOSIS — Z3041 Encounter for surveillance of contraceptive pills: Secondary | ICD-10-CM

## 2020-05-29 MED ORDER — NORTREL 1/35 (28) 1-35 MG-MCG PO TABS: 1.0000 | ORAL_TABLET | Freq: Every day | ORAL | 0 refills | Status: DC

## 2020-05-29 NOTE — Progress Notes (Signed)
Pt called for refill on BC.  Refill sent per protocol.  Pt is scheduled for AEX in May.

## 2020-06-17 ENCOUNTER — Other Ambulatory Visit: Payer: Self-pay

## 2020-06-17 ENCOUNTER — Other Ambulatory Visit (HOSPITAL_COMMUNITY)
Admission: RE | Admit: 2020-06-17 | Discharge: 2020-06-17 | Disposition: A | Discharge status: Home / Self Care | Payer: BC Managed Care – PPO | Source: Ambulatory Visit | Attending: Obstetrics | Admitting: Obstetrics

## 2020-06-17 ENCOUNTER — Ambulatory Visit (INDEPENDENT_AMBULATORY_CARE_PROVIDER_SITE_OTHER): Payer: BC Managed Care – PPO | Admitting: Obstetrics

## 2020-06-17 ENCOUNTER — Encounter: Payer: Self-pay | Admitting: Obstetrics

## 2020-06-17 VITALS — BP 142/91 | HR 77 | Ht 66.0 in | Wt 216.0 lb

## 2020-06-17 DIAGNOSIS — Z01419 Encounter for gynecological examination (general) (routine) without abnormal findings: Secondary | ICD-10-CM | POA: Insufficient documentation

## 2020-06-17 DIAGNOSIS — R350 Frequency of micturition: Secondary | ICD-10-CM | POA: Diagnosis not present

## 2020-06-17 DIAGNOSIS — J301 Allergic rhinitis due to pollen: Secondary | ICD-10-CM

## 2020-06-17 DIAGNOSIS — N898 Other specified noninflammatory disorders of vagina: Secondary | ICD-10-CM | POA: Diagnosis present

## 2020-06-17 DIAGNOSIS — I10 Essential (primary) hypertension: Secondary | ICD-10-CM

## 2020-06-17 DIAGNOSIS — Z113 Encounter for screening for infections with a predominantly sexual mode of transmission: Secondary | ICD-10-CM

## 2020-06-17 DIAGNOSIS — E669 Obesity, unspecified: Secondary | ICD-10-CM

## 2020-06-17 DIAGNOSIS — Z3041 Encounter for surveillance of contraceptive pills: Secondary | ICD-10-CM

## 2020-06-17 LAB — POCT URINALYSIS DIPSTICK
Bilirubin, UA: NEGATIVE
Glucose, UA: NEGATIVE
Ketones, UA: NEGATIVE
Leukocytes, UA: NEGATIVE
Nitrite, UA: NEGATIVE
Protein, UA: NEGATIVE
Spec Grav, UA: <=1.005 — AB (ref 1.010–1.025)
Urobilinogen, UA: 0.2 E.U./dL
pH, UA: 6 (ref 5.0–8.0)

## 2020-06-17 MED ORDER — LORATADINE 10 MG PO TABS: 10.0000 mg | ORAL_TABLET | Freq: Every day | ORAL | 11 refills | Status: DC

## 2020-06-17 MED ORDER — PRENATAL PLUS 27-1 MG PO TABS: 1.0000 | ORAL_TABLET | Freq: Every day | ORAL | 11 refills | Status: DC

## 2020-06-17 NOTE — Progress Notes (Signed)
Subjective:        Carla Morrow is a 39 y.o. female here for a routine exam.  Current complaints: Nocturnal urinary frequency and vaginal discharge.    Personal health questionnaire:  Is patient Ashkenazi Jewish, have a family history of breast and/or ovarian cancer: no Is there a family history of uterine cancer diagnosed at age < 28, gastrointestinal cancer, urinary tract cancer, family member who is a Personnel officer syndrome-associated carrier: no Is the patient overweight and hypertensive, family history of diabetes, personal history of gestational diabetes, preeclampsia or PCOS: no Is patient over 50, have PCOS,  family history of premature CHD under age 33, diabetes, smoke, have hypertension or peripheral artery disease:  no At any time, has a partner hit, kicked or otherwise hurt or frightened you?: no Over the past 2 weeks, have you felt down, depressed or hopeless?: no Over the past 2 weeks, have you felt little interest or pleasure in doing things?:no   Gynecologic History Patient's last menstrual period was 05/27/2020. Contraception: OCP (estrogen/progesterone) Last Pap: 03-28-2019. Results were: normal Last mammogram: n/a. Results were: n/a  Obstetric History OB History  Gravida Para Term Preterm AB Living  4 4 3 1  0 4  SAB IAB Ectopic Multiple Live Births  0 0 0 0 4    # Outcome Date GA Lbr Len/2nd Weight Sex Delivery Anes PTL Lv  4 Term 07/07/15 [redacted]w[redacted]d 08:45 6 lb 11.1 oz (3.035 kg) M Vag-Spont None  LIV  3 Preterm 09/27/11 [redacted]w[redacted]d 08:26 / 00:11 5 lb 15.1 oz (2.696 kg) M Vag-Spont None  LIV     Birth Comments: WNL  2 Term 03/24/03 100w0d  6 lb 12 oz (3.062 kg) F Vag-Spont EPI N LIV  1 Term 04/20/97 [redacted]w[redacted]d  7 lb 8 oz (3.402 kg) F Vag-Spont EPI N LIV    Past Medical History:  Diagnosis Date  . Medical history non-contributory   . No pertinent past medical history     Past Surgical History:  Procedure Laterality Date  . NO PAST SURGERIES       Current Outpatient  Medications:  .  amLODipine (NORVASC) 5 MG tablet, TK 1 T PO QD, Disp: , Rfl:  .  loratadine (CLARITIN) 10 MG tablet, Take 1 tablet (10 mg total) by mouth daily., Disp: 30 tablet, Rfl: 11 .  norethindrone-ethinyl estradiol 1/35 (NORTREL 1/35, 28,) tablet, Take 1 tablet by mouth daily., Disp: 84 tablet, Rfl: 0 .  prenatal vitamin w/FE, FA (PRENATAL 1 + 1) 27-1 MG TABS tablet, Take 1 tablet by mouth daily before breakfast., Disp: 30 tablet, Rfl: 11 .  Prenat-FeAsp-Meth-FA-DHA w/o A (PRENATE PIXIE) 10-0.6-0.4-200 MG CAPS, Take 1 tablet by mouth daily., Disp: 30 capsule, Rfl: 12 No Known Allergies  Social History   Tobacco Use  . Smoking status: Never Smoker  . Smokeless tobacco: Never Used  Substance Use Topics  . Alcohol use: No    Alcohol/week: 0.0 standard drinks    Family History  Problem Relation Age of Onset  . Anuerysm Mother   . Hypertension Father       Review of Systems  Constitutional: negative for fatigue and weight loss Respiratory: negative for cough and wheezing Cardiovascular: negative for chest pain, fatigue and palpitations Gastrointestinal: negative for abdominal pain and change in bowel habits Musculoskeletal:negative for myalgias Neurological: negative for gait problems and tremors Behavioral/Psych: negative for abusive relationship, depression Endocrine: negative for temperature intolerance    Genitourinary: positive for vaginal discharge and urinary frequency.  negative for abnormal menstrual periods, genital lesions, hot flashes, sexual problems  Integument/breast: negative for breast lump, breast tenderness, nipple discharge and skin lesion(s)    Objective:       BP (!) 142/91   Pulse 77   Ht 5\' 6"  (1.676 m)   Wt 216 lb (98 kg)   LMP 05/27/2020   BMI 34.86 kg/m  General:   alert and no distress  Skin:   no rash or abnormalities  Lungs:   clear to auscultation bilaterally  Heart:   regular rate and rhythm, S1, S2 normal, no murmur, click, rub or  gallop  Breasts:   normal without suspicious masses, skin or nipple changes or axillary nodes  Abdomen:  normal findings: no organomegaly, soft, non-tender and no hernia  Pelvis:  External genitalia: normal general appearance Urinary system: urethral meatus normal and bladder without fullness, nontender Vaginal: normal without tenderness, induration or masses Cervix: normal appearance Adnexa: normal bimanual exam Uterus: anteverted and non-tender, normal size   Lab Review Urine pregnancy test Labs reviewed yes Radiologic studies reviewed no  I have spent a total of 20 minutes of face-to-face time, excluding clinical staff time, reviewing notes and preparing to see patient, ordering tests and/or medications, and counseling the patient.  Assessment:     1. Encounter for gynecological examination with Papanicolaou smear of cervix Rx: - Cytology - PAP( [facility]) - POCT urinalysis dipstick - prenatal vitamin w/FE, FA (PRENATAL 1 + 1) 27-1 MG TABS tablet; Take 1 tablet by mouth daily before breakfast.  Dispense: 30 tablet; Refill: 11  2. Urinary frequency, nocturnal Rx: - Urine Culture - Ambulatory referral to Urogynecology  3. Vaginal discharge Rx: - Cervicovaginal ancillary only( [place])  4. Screening for STD (sexually transmitted disease) Rx: - HIV Antibody (routine testing w rflx) - Hepatitis B surface antigen - Hepatitis C antibody - RPR  5. HTN (hypertension), benign Rx: - Ambulatory referral to Internal Medicine  6. Obesity (BMI 35.0-39.9 without comorbidity) - program of caloric reduction, exercise and behavioral modification recommended  7. Encounter for surveillance of contraceptive pills - pleased with OCP's  8. Seasonal allergic rhinitis due to pollen Rx: - loratadine (CLARITIN) 10 MG tablet; Take 1 tablet (10 mg total) by mouth daily.  Dispense: 30 tablet; Refill: 11    Plan:    Education reviewed: calcium supplements, depression evaluation,  low fat, low cholesterol diet, safe sex/STD prevention, self breast exams and weight bearing exercise. Contraception: OCP (estrogen/progesterone). Follow up in: 1 year.   Meds ordered this encounter  Medications  . prenatal vitamin w/FE, FA (PRENATAL 1 + 1) 27-1 MG TABS tablet    Sig: Take 1 tablet by mouth daily before breakfast.    Dispense:  30 tablet    Refill:  11  . loratadine (CLARITIN) 10 MG tablet    Sig: Take 1 tablet (10 mg total) by mouth daily.    Dispense:  30 tablet    Refill:  11   Orders Placed This Encounter  Procedures  . Urine Culture  . HIV Antibody (routine testing w rflx)  . Hepatitis B surface antigen  . Hepatitis C antibody  . RPR  . Ambulatory referral to Internal Medicine    Referral Priority:   Routine    Referral Type:   Consultation    Referral Reason:   Specialty Services Required    Requested Specialty:   Internal Medicine    Number of Visits Requested:   1  . Ambulatory referral  to Urogynecology    Referral Priority:   Routine    Referral Type:   Consultation    Referral Reason:   Specialty Services Required    Requested Specialty:   Urology    Number of Visits Requested:   1  . POCT urinalysis dipstick    Brock Bad, MD 06/17/2020 3:29 PM

## 2020-06-18 LAB — CERVICOVAGINAL ANCILLARY ONLY
Bacterial Vaginitis (gardnerella): NEGATIVE
Candida Glabrata: NEGATIVE
Candida Vaginitis: NEGATIVE
Chlamydia: NEGATIVE
Comment: NEGATIVE
Comment: NEGATIVE
Comment: NEGATIVE
Comment: NEGATIVE
Comment: NEGATIVE
Comment: NORMAL
Neisseria Gonorrhea: NEGATIVE
Trichomonas: NEGATIVE

## 2020-06-18 LAB — HEPATITIS B SURFACE ANTIGEN: Hepatitis B Surface Ag: NEGATIVE

## 2020-06-18 LAB — HIV ANTIBODY (ROUTINE TESTING W REFLEX): HIV Screen 4th Generation wRfx: NONREACTIVE

## 2020-06-18 LAB — HEPATITIS C ANTIBODY: Hep C Virus Ab: <0.1 s/co ratio (ref 0.0–0.9)

## 2020-06-18 LAB — RPR: RPR Ser Ql: NONREACTIVE

## 2020-06-19 LAB — CYTOLOGY - PAP
Diagnosis: NEGATIVE
Diagnosis: REACTIVE

## 2020-06-19 LAB — URINE CULTURE

## 2020-07-01 ENCOUNTER — Encounter: Payer: Self-pay | Admitting: Obstetrics

## 2020-08-26 ENCOUNTER — Other Ambulatory Visit: Payer: Self-pay | Admitting: Obstetrics & Gynecology

## 2020-08-26 DIAGNOSIS — Z3041 Encounter for surveillance of contraceptive pills: Secondary | ICD-10-CM

## 2020-09-20 ENCOUNTER — Ambulatory Visit: Payer: BC Managed Care – PPO | Admitting: Obstetrics and Gynecology

## 2020-11-05 ENCOUNTER — Ambulatory Visit: Payer: BC Managed Care – PPO | Admitting: Emergency Medicine

## 2020-11-07 ENCOUNTER — Ambulatory Visit (INDEPENDENT_AMBULATORY_CARE_PROVIDER_SITE_OTHER): Payer: BC Managed Care – PPO | Admitting: Emergency Medicine

## 2020-11-07 ENCOUNTER — Other Ambulatory Visit: Payer: Self-pay

## 2020-11-07 ENCOUNTER — Encounter: Payer: Self-pay | Admitting: Emergency Medicine

## 2020-11-07 VITALS — BP 138/70 | HR 75 | Temp 98.6°F | Ht 66.0 in | Wt 215.0 lb

## 2020-11-07 DIAGNOSIS — Z7689 Persons encountering health services in other specified circumstances: Secondary | ICD-10-CM

## 2020-11-07 DIAGNOSIS — Z6836 Body mass index (BMI) 36.0-36.9, adult: Secondary | ICD-10-CM | POA: Insufficient documentation

## 2020-11-07 DIAGNOSIS — I10 Essential (primary) hypertension: Secondary | ICD-10-CM | POA: Diagnosis not present

## 2020-11-07 DIAGNOSIS — Z6834 Body mass index (BMI) 34.0-34.9, adult: Secondary | ICD-10-CM

## 2020-11-07 LAB — LIPID PANEL
Cholesterol: 159 mg/dL (ref 0–200)
HDL: 54.8 mg/dL (ref >39.00)
NonHDL: 104.1
Total CHOL/HDL Ratio: 3
Triglycerides: 207 mg/dL — ABNORMAL HIGH (ref 0.0–149.0)
VLDL: 41.4 mg/dL — ABNORMAL HIGH (ref 0.0–40.0)

## 2020-11-07 LAB — COMPREHENSIVE METABOLIC PANEL
ALT: 14 U/L (ref 0–35)
AST: 12 U/L (ref 0–37)
Albumin: 3.9 g/dL (ref 3.5–5.2)
Alkaline Phosphatase: 59 U/L (ref 39–117)
BUN: 10 mg/dL (ref 6–23)
CO2: 28 mEq/L (ref 19–32)
Calcium: 8.8 mg/dL (ref 8.4–10.5)
Chloride: 101 mEq/L (ref 96–112)
Creatinine, Ser: 0.85 mg/dL (ref 0.40–1.20)
GFR: 87.15 mL/min (ref >60.00)
Glucose, Bld: 85 mg/dL (ref 70–99)
Potassium: 3.7 mEq/L (ref 3.5–5.1)
Sodium: 136 mEq/L (ref 135–145)
Total Bilirubin: 0.3 mg/dL (ref 0.2–1.2)
Total Protein: 7.2 g/dL (ref 6.0–8.3)

## 2020-11-07 LAB — HEMOGLOBIN A1C: Hgb A1c MFr Bld: 6 % (ref 4.6–6.5)

## 2020-11-07 LAB — LDL CHOLESTEROL, DIRECT: Direct LDL: 85 mg/dL

## 2020-11-07 MED ORDER — HYDROCHLOROTHIAZIDE 12.5 MG PO TABS: 12.5000 mg | ORAL_TABLET | Freq: Every day | ORAL | 3 refills | Status: DC

## 2020-11-07 MED ORDER — AMLODIPINE BESYLATE 5 MG PO TABS: 5.0000 mg | ORAL_TABLET | Freq: Every day | ORAL | 3 refills | Status: DC

## 2020-11-07 NOTE — Assessment & Plan Note (Signed)
Diet and nutrition discussed. 

## 2020-11-07 NOTE — Progress Notes (Addendum)
Carla Morrow 40 y.o.   Chief Complaint  Patient presents with   New Patient (Initial Visit)    Pt would like have BP managed, refill of amlodipine    HISTORY OF PRESENT ILLNESS: This is a 40 y.o. female first visit to this office, here to establish care with me. Has history of hypertension presently taking amlodipine 5 mg daily.  States her blood pressure readings have been higher than today's. Asymptomatic. No other significant past medical history. No other complaint or medical concerns today. BP Readings from Last 3 Encounters:  11/07/20 140/82  06/17/20 (!) 142/91  03/28/19 133/79     HPI   Prior to Admission medications   Medication Sig Start Date End Date Taking? Authorizing Provider  amLODipine (NORVASC) 5 MG tablet TK 1 T PO QD 02/14/18  Yes [provider]  NORTREL 1/35, 28, tablet TAKE 1 TABLET BY MOUTH DAILY 08/26/20  Yes Anyanwu, Jethro Bastos, MD  loratadine (CLARITIN) 10 MG tablet Take 1 tablet (10 mg total) by mouth daily. Patient not taking: Reported on 11/07/2020 06/17/20   Brock Bad, MD  Prenat-FeAsp-Meth-FA-DHA w/o A (PRENATE PIXIE) 10-0.6-0.4-200 MG CAPS Take 1 tablet by mouth daily. Patient not taking: Reported on 11/07/2020 07/09/15   Orvilla Cornwall A, CNM  prenatal vitamin w/FE, FA (PRENATAL 1 + 1) 27-1 MG TABS tablet Take 1 tablet by mouth daily before breakfast. Patient not taking: Reported on 11/07/2020 06/17/20   Brock Bad, MD    No Known Allergies  Patient Active Problem List   Diagnosis Date Noted   Encounter for IUD insertion 08/28/2015   Normal labor 07/07/2015   NVD (normal vaginal delivery) 07/07/2015   Umbilical cord, velamentous insertion 03/09/2015   H/O premature delivery 02/08/2015   History of hypertension 02/08/2015   PROM (premature rupture of membranes) 09/27/2011   Normal delivery 09/27/2011    Past Medical History:  Diagnosis Date   Medical history non-contributory    No pertinent past medical history      Past Surgical History:  Procedure Laterality Date   NO PAST SURGERIES      Social History   Socioeconomic History   Marital status: Significant Other    Spouse name: Not on file   Number of children: Not on file   Years of education: Not on file   Highest education level: Not on file  Occupational History   Not on file  Tobacco Use   Smoking status: Never   Smokeless tobacco: Never  Vaping Use   Vaping Use: Never used  Substance and Sexual Activity   Alcohol use: No    Alcohol/week: 0.0 standard drinks   Drug use: No   Sexual activity: Yes    Partners: Male    Birth control/protection: Pill  Other Topics Concern   Not on file  Social History Narrative   ** Merged History Encounter **       ** Merged History Encounter **       Social Determinants of Health   Financial Resource Strain: Not on file  Food Insecurity: Not on file  Transportation Needs: Not on file  Physical Activity: Not on file  Stress: Not on file  Social Connections: Not on file  Intimate Partner Violence: Not on file    Family History  Problem Relation Age of Onset   Anuerysm Mother    Hypertension Father      Review of Systems  Constitutional: Negative.  Negative for chills and fever.  HENT: Negative.  Negative for congestion and sore throat.   Respiratory: Negative.  Negative for cough and shortness of breath.   Cardiovascular: Negative.  Negative for chest pain and palpitations.  Gastrointestinal: Negative.  Negative for abdominal pain, diarrhea, nausea and vomiting.  Genitourinary: Negative.  Negative for dysuria and hematuria.  Skin: Negative.  Negative for rash.  Neurological:  Negative for dizziness and headaches.   Today's Vitals   11/07/20 1501 11/07/20 1515  BP: 140/82 138/70  Pulse: 75   Temp: 98.6 F (37 C)   TempSrc: Oral   SpO2: 96%   Weight: 215 lb (97.5 kg)   Height: 5\' 6"  (1.676 m)    Body mass index is 34.7 kg/m.  Physical Exam Vitals reviewed.   Constitutional:      Appearance: Normal appearance.  HENT:     Head: Normocephalic.  Eyes:     Extraocular Movements: Extraocular movements intact.     Conjunctiva/sclera: Conjunctivae normal.     Pupils: Pupils are equal, round, and reactive to light.  Cardiovascular:     Rate and Rhythm: Normal rate and regular rhythm.     Pulses: Normal pulses.     Heart sounds: Normal heart sounds.  Pulmonary:     Effort: Pulmonary effort is normal.     Breath sounds: Normal breath sounds.  Musculoskeletal:        General: Normal range of motion.     Cervical back: Normal range of motion and neck supple.  Skin:    General: Skin is warm and dry.     Capillary Refill: Capillary refill takes less than 2 seconds.  Neurological:     General: No focal deficit present.     Mental Status: She is alert and oriented to person, place, and time.  Psychiatric:        Mood and Affect: Mood normal.        Behavior: Behavior normal.     ASSESSMENT & PLAN: Problem List Items Addressed This Visit       Cardiovascular and Mediastinum   Essential hypertension - Primary    Elevated blood pressure readings here and at home. Continue amlodipine 5 mg daily and add hydrochlorothiazide 12.5 mg daily. Dietary approaches to stop hypertension discussed with patient. Follow-up in 6 months.      Relevant Medications   hydrochlorothiazide (HYDRODIURIL) 12.5 MG tablet   amLODipine (NORVASC) 5 MG tablet   Other Relevant Orders   Comprehensive metabolic panel (Completed)   Hemoglobin A1c (Completed)   Lipid panel (Completed)     Other   Body mass index (BMI) of 34.0-34.9 in adult    Diet and nutrition discussed.       Other Visit Diagnoses     Encounter to establish care            Patient Instructions  Hypertension, Adult High blood pressure (hypertension) is when the force of blood pumping through the arteries is too strong. The arteries are the blood vessels that carry blood from the heart  throughout the body. Hypertension forces the heart to work harder to pump blood and may cause arteries to become narrow or stiff. Untreated or uncontrolled hypertension can cause a heart attack, heart failure, a stroke, kidney disease, and other problems. A blood pressure reading consists of a higher number over a lower number. Ideally, your blood pressure should be below 120/80. The first ("top") number is called the systolic pressure. It is a measure of the pressure in your arteries as your heart beats.  The second ("bottom") number is called the diastolic pressure. It is a measure of the pressure in your arteries as the heart relaxes. What are the causes? The exact cause of this condition is not known. There are some conditions that result in or are related to high blood pressure. What increases the risk? Some risk factors for high blood pressure are under your control. The following factors may make you more likely to develop this condition: Smoking. Having type 2 diabetes mellitus, high cholesterol, or both. Not getting enough exercise or physical activity. Being overweight. Having too much fat, sugar, calories, or salt (sodium) in your diet. Drinking too much alcohol. Some risk factors for high blood pressure may be difficult or impossible to change. Some of these factors include: Having chronic kidney disease. Having a family history of high blood pressure. Age. Risk increases with age. Race. You may be at higher risk if you are African American. Gender. Men are at higher risk than women before age 69. After age 103, women are at higher risk than men. Having obstructive sleep apnea. Stress. What are the signs or symptoms? High blood pressure may not cause symptoms. Very high blood pressure (hypertensive crisis) may cause: Headache. Anxiety. Shortness of breath. Nosebleed. Nausea and vomiting. Vision changes. Severe chest pain. Seizures. How is this diagnosed? This condition is  diagnosed by measuring your blood pressure while you are seated, with your arm resting on a flat surface, your legs uncrossed, and your feet flat on the floor. The cuff of the blood pressure monitor will be placed directly against the skin of your upper arm at the level of your heart. It should be measured at least twice using the same arm. Certain conditions can cause a difference in blood pressure between your right and left arms. Certain factors can cause blood pressure readings to be lower or higher than normal for a short period of time: When your blood pressure is higher when you are in a health care provider's office than when you are at home, this is called white coat hypertension. Most people with this condition do not need medicines. When your blood pressure is higher at home than when you are in a health care provider's office, this is called masked hypertension. Most people with this condition may need medicines to control blood pressure. If you have a high blood pressure reading during one visit or you have normal blood pressure with other risk factors, you may be asked to: Return on a different day to have your blood pressure checked again. Monitor your blood pressure at home for 1 week or longer. If you are diagnosed with hypertension, you may have other blood or imaging tests to help your health care provider understand your overall risk for other conditions. How is this treated? This condition is treated by making healthy lifestyle changes, such as eating healthy foods, exercising more, and reducing your alcohol intake. Your health care provider may prescribe medicine if lifestyle changes are not enough to get your blood pressure under control, and if: Your systolic blood pressure is above 130. Your diastolic blood pressure is above 80. Your personal target blood pressure may vary depending on your medical conditions, your age, and other factors. Follow these instructions at  home: Eating and drinking  Eat a diet that is high in fiber and potassium, and low in sodium, added sugar, and fat. An example eating plan is called the DASH (Dietary Approaches to Stop Hypertension) diet. To eat this way: Eat  plenty of fresh fruits and vegetables. Try to fill one half of your plate at each meal with fruits and vegetables. Eat whole grains, such as whole-wheat pasta, brown rice, or whole-grain bread. Fill about one fourth of your plate with whole grains. Eat or drink low-fat dairy products, such as skim milk or low-fat yogurt. Avoid fatty cuts of meat, processed or cured meats, and poultry with skin. Fill about one fourth of your plate with lean proteins, such as fish, chicken without skin, beans, eggs, or tofu. Avoid pre-made and processed foods. These tend to be higher in sodium, added sugar, and fat. Reduce your daily sodium intake. Most people with hypertension should eat less than 1,500 mg of sodium a day. Do not drink alcohol if: Your health care provider tells you not to drink. You are pregnant, may be pregnant, or are planning to become pregnant. If you drink alcohol: Limit how much you use to: 0-1 drink a day for women. 0-2 drinks a day for men. Be aware of how much alcohol is in your drink. In the [place] one drink equals one 12 oz bottle of beer (355 mL), one 5 oz glass of wine (148 mL), or one 1 oz glass of hard liquor (44 mL). Lifestyle  Work with your health care provider to maintain a healthy body weight or to lose weight. Ask what an ideal weight is for you. Get at least 30 minutes of exercise most days of the week. Activities may include walking, swimming, or biking. Include exercise to strengthen your muscles (resistance exercise), such as Pilates or lifting weights, as part of your weekly exercise routine. Try to do these types of exercises for 30 minutes at least 3 days a week. Do not use any products that contain nicotine or tobacco, such as cigarettes,  e-cigarettes, and chewing tobacco. If you need help quitting, ask your health care provider. Monitor your blood pressure at home as told by your health care provider. Keep all follow-up visits as told by your health care provider. This is important. Medicines Take over-the-counter and prescription medicines only as told by your health care provider. Follow directions carefully. Blood pressure medicines must be taken as prescribed. Do not skip doses of blood pressure medicine. Doing this puts you at risk for problems and can make the medicine less effective. Ask your health care provider about side effects or reactions to medicines that you should watch for. Contact a health care provider if you: Think you are having a reaction to a medicine you are taking. Have headaches that keep coming back (recurring). Feel dizzy. Have swelling in your ankles. Have trouble with your vision. Get help right away if you: Develop a severe headache or confusion. Have unusual weakness or numbness. Feel faint. Have severe pain in your chest or abdomen. Vomit repeatedly. Have trouble breathing. Summary Hypertension is when the force of blood pumping through your arteries is too strong. If this condition is not controlled, it may put you at risk for serious complications. Your personal target blood pressure may vary depending on your medical conditions, your age, and other factors. For most people, a normal blood pressure is less than 120/80. Hypertension is treated with lifestyle changes, medicines, or a combination of both. Lifestyle changes include losing weight, eating a healthy, low-sodium diet, exercising more, and limiting alcohol. This information is not intended to replace advice given to you by your health care provider. Make sure you discuss any questions you have with your health care provider.  Document Revised: 10/06/2017 Document Reviewed: 10/06/2017 Elsevier Patient Education  2022 [employer]  Inc.    Edwina Barth, MD [facility]

## 2020-11-07 NOTE — Assessment & Plan Note (Addendum)
Elevated blood pressure readings here and at home. Continue amlodipine 5 mg daily and add hydrochlorothiazide 12.5 mg daily. Dietary approaches to stop hypertension discussed with patient. Follow-up in 6 months.

## 2020-11-07 NOTE — Patient Instructions (Signed)

## 2020-11-18 ENCOUNTER — Ambulatory Visit: Payer: BC Managed Care – PPO | Admitting: Obstetrics and Gynecology

## 2020-11-18 NOTE — Progress Notes (Deleted)
[facility] New Patient Evaluation and Consultation  Referring Provider: Brock Bad, MD PCP: Georgina Quint, MD Date of Service: 11/18/2020  SUBJECTIVE Chief Complaint: No chief complaint on file.  History of Present Illness: Carla Morrow is a 40 y.o. Black or African-American female seen in consultation at the request of Dr. Clearance Coots for evaluation of urianry frequency. .    Review of records from Dr Clearance Coots significant for: Having increased urinary frequency, especially at night  Urinary Symptoms: {urine leakage?:24754} Leaks *** time(s) per {days/wks/mos/yrs:310907}.  Pad use: {NUMBERS 1-10:18281} {pad option:24752} per day.   She {ACTION; IS/IS KGU:54270623} bothered by her UI symptoms.  Day time voids ***.  Nocturia: *** times per night to void. Voiding dysfunction: she {empties:24755} her bladder well.  {DOES NOT does:27190::"does not"} use a catheter to empty bladder.  When urinating, she feels {urine symptoms:24756} Drinks: *** per day  UTIs: {NUMBERS 1-10:18281} UTI's in the last year.   {ACTIONS;DENIES/REPORTS:21021675::"Denies"} history of {urologic concerns:24757}  Pelvic Organ Prolapse Symptoms:                  She {denies/ admits to:24761} a feeling of a bulge the vaginal area. It has been present for {NUMBER 1-10:22536} {days/wks/mos/yrs:310907}.  She {denies/ admits to:24761} seeing a bulge.  This bulge {ACTION; IS/IS JSE:83151761} bothersome.  Bowel Symptom: Bowel movements: *** time(s) per {Time; day/week/month:13537} Stool consistency: {stool consistency:24758} Straining: {yes/no:19897}.  Splinting: {yes/no:19897}.  Incomplete evacuation: {yes/no:19897}.  She {denies/ admits to:24761} accidental bowel leakage / fecal incontinence  Occurs: *** time(s) per {Time; day/week/month:13537}  Consistency with leakage: {stool consistency:24758} Bowel regimen: {bowel regimen:24759} Last colonoscopy: Date ***, Results ***  Sexual  Function Sexually active: {yes/no:19897}.  Sexual orientation: {Sexual [phone]} Pain with sex: {pain with sex:24762}  Pelvic Pain {denies/ admits to:24761} pelvic pain Location: *** Pain occurs: *** Prior pain treatment: *** Improved by: *** Worsened by: ***   Past Medical History:  Past Medical History:  Diagnosis Date   Medical history non-contributory    No pertinent past medical history      Past Surgical History:   Past Surgical History:  Procedure Laterality Date   NO PAST SURGERIES       Past OB/GYN History: G{NUMBERS 1-10:18281} P{NUMBERS 1-10:18281} Vaginal deliveries: ***,  Forceps/ Vacuum deliveries: ***, Cesarean section: *** Menopausal: {menopausal:24763} Contraception: ***. Last pap smear was ***.  Any history of abnormal pap smears: {yes/no:19897}.   Medications: She has a current medication list which includes the following prescription(s): amlodipine, hydrochlorothiazide, loratadine, nortrel 1/35 (28), prenate pixie, and prenatal vitamin w/fe, fa.   Allergies: Patient has No Known Allergies.   Social History:  Social History   Tobacco Use   Smoking status: Never   Smokeless tobacco: Never  Vaping Use   Vaping Use: Never used  Substance Use Topics   Alcohol use: No    Alcohol/week: 0.0 standard drinks   Drug use: No    Relationship status: {relationship status:24764} She lives with ***.   She {ACTION; IS/IS YWV:37106269} employed ***. Regular exercise: {Yes/No:304960894} History of abuse: {Yes/No:304960894}  Family History:   Family History  Problem Relation Age of Onset   Anuerysm Mother    Hypertension Father      Review of Systems: ROS   OBJECTIVE Physical Exam: There were no vitals filed for this visit.  Physical Exam   GU / Detailed Urogynecologic Evaluation:  Pelvic Exam: Normal external female genitalia; Bartholin's and Skene's glands normal in appearance; urethral meatus normal in appearance, no  urethral  masses or discharge.   CST: {gen negative/positive:315881}  Reflexes: bulbocavernosis {DESC; PRESENT/NOT PRESENT:21021351}, anocutaneous {DESC; PRESENT/NOT PRESENT:21021351} ***bilaterally.  Speculum exam reveals normal vaginal mucosa {With/Without:20273} atrophy. Cervix {exam; gyn cervix:30847}. Uterus {exam; pelvic uterus:30849}. Adnexa {exam; adnexa:12223}.    s/p hysterectomy: Speculum exam reveals normal vaginal mucosa {With/Without:20273}  atrophy and normal vaginal cuff.  Adnexa {exam; adnexa:12223}.    With apex supported, anterior compartment defect was {reduced:24765}  Pelvic floor strength {Roman # I-V:19040}/V, puborectalis {Roman # I-V:19040}/V external anal sphincter {Roman # I-V:19040}/V  Pelvic floor musculature: Right levator {Tender/Non-tender:20250}, Right obturator {Tender/Non-tender:20250}, Left levator {Tender/Non-tender:20250}, Left obturator {Tender/Non-tender:20250}  POP-Q:   POP-Q                                               Aa                                               Ba                                                 C                                                Gh                                               Pb                                               tvl                                                Ap                                               Bp                                                 D     Rectal Exam:  Normal sphincter tone, {rectocele:24766} distal rectocele, enterocoele {DESC; PRESENT/NOT PRESENT:21021351}, no rectal masses, {sign of:24767} dyssynergia when asking the patient to bear down.  Post-Void Residual (PVR) by Bladder Scan: In order to evaluate bladder emptying, we discussed obtaining a postvoid residual and she agreed to this procedure.  Procedure: The ultrasound unit was  placed on the patient's abdomen in the suprapubic region after the patient had voided. A PVR of *** ml was obtained by  bladder scan.  Laboratory Results: @ENCLABS @   ***I visualized the urine specimen, noting the specimen to be {urine color:24768}  ASSESSMENT AND PLAN Ms. is a 40 y.o. with: No diagnosis found.    20, MD   Medical Decision Making:  - Reviewed/ ordered a clinical laboratory test - Reviewed/ ordered a radiologic study - Reviewed/ ordered medicine test - Decision to obtain old records - Discussion of management of or test interpretation with an external physician / other healthcare professional  - Assessment requiring independent historian - Review and summation of prior records - Independent review of image, tracing or specimen

## 2020-12-05 NOTE — Progress Notes (Signed)
[facility] New Patient Evaluation and Consultation  Referring Provider: Brock Bad, MD PCP: Georgina Quint, MD Date of Service: 12/06/2020  SUBJECTIVE Chief Complaint: New Patient (Initial Visit) (Urinary frequency)  History of Present Illness: Carla Morrow is a 40 y.o. Black or African-American female seen in consultation at the request of Dr. Clearance Coots for evaluation of nocturia.    Review of records from Dr Clearance Coots significant for: Is having urinary frequency at night  Urinary Symptoms: Do you leak urine? No  How many times do you urinate in the daytime? 4 to 6  How many times do you wake up at night to urinate? 3  When you urinate, does it feel like you empty your bladder completely? Yes  Do you use a catheter to help empty your bladder? No   Has not tried any medications for urinary frequency. Mostly occurs at night When urinating, she feels she has no difficulties Drinks: one soda (usually lunch time), otherwise water.  Drinks until 9-10pm, goes to bed at 10:30 Does not snore  UTIs:  0  UTI's in the last year.   Denies history of blood in urine and kidney or bladder stones  Pelvic Organ Prolapse Symptoms:                  Do you feel a bulge in the vagina? No    Bowel Symptom: How often do you have a bowel movement? 1 or 2 times a day  What is the consistency of your stools? hard  Do you strain to empty your bowels? No  Do you have to push on your rectum or vagina to be able to empty your bowels? No  Do you have difficulty completely emptying your rectum during a bowel movement? No  Do you ever leak bowel contents? No    Sexual Function Sexually active: yes.  Pain with sex: No  Pelvic Pain Denies pelvic pain   Past Medical History:  Past Medical History:  Diagnosis Date   Hypertension    Medical history non-contributory    No pertinent past medical history      Past Surgical History:   Past Surgical History:   Procedure Laterality Date   NO PAST SURGERIES       Past OB/GYN History: OB History  Gravida Para Term Preterm AB Living  4 4 3 1  0 4  SAB IAB Ectopic Multiple Live Births  0 0 0   4    # Outcome Date GA Lbr Len/2nd Weight Sex Delivery Anes PTL Lv  4 Term 07/07/15 [redacted]w[redacted]d 08:45 6 lb 11.1 oz (3.035 kg) M Vag-Spont None  LIV  3 Preterm 09/27/11 [redacted]w[redacted]d 08:26 / 00:11 5 lb 15.1 oz (2.696 kg) M Vag-Spont None  LIV     Birth Comments: WNL  2 Term 03/24/03 [redacted]w[redacted]d  6 lb 12 oz (3.062 kg) F Vag-Spont EPI N LIV  1 Term 04/20/97 [redacted]w[redacted]d  7 lb 8 oz (3.402 kg) F Vag-Spont EPI N LIV   Date of last mentrual period 9/25  Current birth control use Pill  What was the date of your last pap smear? 10/25  What was your last pap smear result? Normal  Do you have a history of abnormal pap smears? Yes     Medications: She has a current medication list which includes the following prescription(s): amlodipine, hydrochlorothiazide, nortrel 1/35 (28), and prenatal vitamin w/fe, fa.   Allergies: Patient has No Known Allergies.   Social History:  Social  History   Tobacco Use   Smoking status: Never   Smokeless tobacco: Never  Vaping Use   Vaping Use: Never used  Substance Use Topics   Alcohol use: No    Alcohol/week: 0.0 standard drinks   Drug use: No    What is your current relationship status? Long-Term partner  Who do you live with? Partner  Are you currently working? Yes  What is your current job? [employer]  Do you exercise regularly? Yes  Please describe Jogging/ walking  Have you ever been emotionally, physically or sexually abused? No  Do you feel safe in your current relationship? Yes    Family History:   Family History  Problem Relation Age of Onset   Anuerysm Mother    Hypertension Father      Review of Systems: Review of Systems  Constitutional:  Negative for fever, malaise/fatigue and weight loss.  Respiratory:  Negative for cough, shortness of breath and wheezing.    Cardiovascular:  Negative for chest pain, palpitations and leg swelling.  Gastrointestinal:  Negative for abdominal pain and blood in stool.  Genitourinary:  Negative for dysuria.  Musculoskeletal:  Negative for myalgias.  Skin:  Negative for rash.  Neurological:  Negative for dizziness and headaches.  Endo/Heme/Allergies:  Bruises/bleeds easily.  Psychiatric/Behavioral:  Negative for depression. The patient is not nervous/anxious.     OBJECTIVE Physical Exam: Vitals:   12/06/20 1510  BP: (!) 162/92  Pulse: 71  Weight: 215 lb (97.5 kg)  Height: 5\' 6"  (1.676 m)    Physical Exam Constitutional:      General: She is not in acute distress. Pulmonary:     Effort: Pulmonary effort is normal.  Abdominal:     General: There is no distension.     Palpations: Abdomen is soft.     Tenderness: There is no abdominal tenderness. There is no rebound.  Musculoskeletal:        General: No swelling. Normal range of motion.  Skin:    General: Skin is warm and dry.     Findings: No rash.  Neurological:     Mental Status: She is alert and oriented to person, place, and time.  Psychiatric:        Mood and Affect: Mood normal.        Behavior: Behavior normal.     GU / Detailed Urogynecologic Evaluation:  Pelvic Exam: Normal external female genitalia; Bartholin's and Skene's glands normal in appearance; urethral meatus normal in appearance, no urethral masses or discharge.   CST: negative Speculum exam reveals normal vaginal mucosa without atrophy. Cervix normal appearance. Uterus normal single, nontender. Adnexa no mass, fullness, tenderness.     Pelvic floor strength III/V  Pelvic floor musculature: Right levator non-tender, Right obturator non-tender, Left levator non-tender, Left obturator non-tender  POP-Q:   POP-Q  -3                                            Aa   -3                                           Ba  -7  C   3                                             Gh  3                                            Pb  11                                            tvl   -2                                            Ap  -2                                            Bp  -10                                              D     Rectal Exam:  Normal external rectum  Post-Void Residual (PVR) by Bladder Scan: In order to evaluate bladder emptying, we discussed obtaining a postvoid residual and she agreed to this procedure.  Procedure: The ultrasound unit was placed on the patient's abdomen in the suprapubic region after the patient had voided. A PVR of 23 ml was obtained by bladder scan.  Laboratory Results: POC urine: trace leukocytes  ASSESSMENT AND PLAN Carla Morrow is a 40 y.o. with:  1. Urinary frequency   2. Nocturia    - We discussed the symptoms of overactive bladder (OAB), which include urinary urgency, urinary frequency, nocturia, with or without urge incontinence.  While we do not know the exact etiology of OAB, several treatment options exist. We discussed management including behavioral therapy (decreasing bladder irritants, urge suppression strategies, timed voids, bladder retraining), physical therapy, medication.  - She will work on decreasing fluid intake at bedtime, and bladder retraining to reduce number of nighttime voids. She does not want to start a medication at this time.   Return if symptoms do not improve.   Marguerita Beards, MD   Medical Decision Making:  - Reviewed/ ordered a clinical laboratory test - Review and summation of prior records

## 2020-12-06 ENCOUNTER — Encounter: Payer: Self-pay | Admitting: Obstetrics and Gynecology

## 2020-12-06 ENCOUNTER — Ambulatory Visit (INDEPENDENT_AMBULATORY_CARE_PROVIDER_SITE_OTHER): Payer: BC Managed Care – PPO | Admitting: Obstetrics and Gynecology

## 2020-12-06 ENCOUNTER — Other Ambulatory Visit: Payer: Self-pay

## 2020-12-06 VITALS — BP 162/92 | HR 71 | Ht 66.0 in | Wt 215.0 lb

## 2020-12-06 DIAGNOSIS — R351 Nocturia: Secondary | ICD-10-CM

## 2020-12-06 DIAGNOSIS — R350 Frequency of micturition: Secondary | ICD-10-CM

## 2020-12-06 LAB — POCT URINALYSIS DIPSTICK
Appearance: NORMAL
Bilirubin, UA: NEGATIVE
Glucose, UA: NEGATIVE
Ketones, UA: NEGATIVE
Nitrite, UA: NEGATIVE
Protein, UA: POSITIVE — AB
Spec Grav, UA: 1.02 (ref 1.010–1.025)
Urobilinogen, UA: 0.2 E.U./dL
pH, UA: 6.5 (ref 5.0–8.0)

## 2020-12-27 ENCOUNTER — Other Ambulatory Visit: Payer: Self-pay

## 2020-12-27 DIAGNOSIS — N898 Other specified noninflammatory disorders of vagina: Secondary | ICD-10-CM

## 2020-12-27 MED ORDER — FLUCONAZOLE 150 MG PO TABS: 150.0000 mg | ORAL_TABLET | Freq: Once | ORAL | 3 refills | Status: AC

## 2020-12-27 NOTE — Progress Notes (Signed)
Pt called stating she has white discharge, with vaginal itching. Pt states this is her typical symptoms when she has a yeast infection.   Advised pt I would send Diflucan to her pharmacy.

## 2021-05-07 ENCOUNTER — Ambulatory Visit: Payer: BC Managed Care – PPO | Admitting: Emergency Medicine

## 2021-05-13 ENCOUNTER — Ambulatory Visit (INDEPENDENT_AMBULATORY_CARE_PROVIDER_SITE_OTHER): Payer: BC Managed Care – PPO | Admitting: Emergency Medicine

## 2021-05-13 ENCOUNTER — Encounter: Payer: Self-pay | Admitting: Emergency Medicine

## 2021-05-13 VITALS — BP 130/70 | HR 78 | Temp 98.7°F | Ht 67.0 in | Wt 218.5 lb

## 2021-05-13 DIAGNOSIS — I10 Essential (primary) hypertension: Secondary | ICD-10-CM | POA: Diagnosis not present

## 2021-05-13 DIAGNOSIS — R7303 Prediabetes: Secondary | ICD-10-CM | POA: Diagnosis not present

## 2021-05-13 MED ORDER — AMLODIPINE BESYLATE 5 MG PO TABS: 5.0000 mg | ORAL_TABLET | Freq: Every day | ORAL | 3 refills | Status: DC

## 2021-05-13 NOTE — Progress Notes (Signed)
Carla Morrow ?41 y.o. ? ? ?Chief Complaint  ?Patient presents with  ? Follow-up  ? underarm irritation  ? ? ?HISTORY OF PRESENT ILLNESS: ?This is a 41 y.o. female with history of hypertension here for follow-up. ?Also complaining of intermittent bilateral axillary irritation for the last several weeks. ?No other complaints or medical concerns today. ? ?HPI ? ? ?Prior to Admission medications   ?Medication Sig Start Date End Date Taking? Authorizing Provider  ?hydrochlorothiazide (HYDRODIURIL) 12.5 MG tablet Take 1 tablet (12.5 mg total) by mouth daily. 11/07/20  Yes Georgina Quint, MD  ?NORTREL 1/35, 28, tablet TAKE 1 TABLET BY MOUTH DAILY 08/26/20  Yes Anyanwu, Jethro Bastos, MD  ?prenatal vitamin w/FE, FA (PRENATAL 1 + 1) 27-1 MG TABS tablet Take 1 tablet by mouth daily before breakfast. 06/17/20  Yes Brock Bad, MD  ?amLODipine (NORVASC) 5 MG tablet Take 1 tablet (5 mg total) by mouth daily. 11/07/20 02/05/21  Georgina Quint, MD  ? ? ?No Known Allergies ? ?Patient Active Problem List  ? Diagnosis Date Noted  ? Body mass index (BMI) of 34.0-34.9 in adult 11/07/2020  ? Essential hypertension 11/07/2020  ? History of hypertension 02/08/2015  ? ? ?Past Medical History:  ?Diagnosis Date  ? Hypertension   ? Medical history non-contributory   ? No pertinent past medical history   ? ? ?Past Surgical History:  ?Procedure Laterality Date  ? NO PAST SURGERIES    ? ? ?Social History  ? ?Socioeconomic History  ? Marital status: Significant Other  ?  Spouse name: Not on file  ? Number of children: Not on file  ? Years of education: Not on file  ? Highest education level: Not on file  ?Occupational History  ? Not on file  ?Tobacco Use  ? Smoking status: Never  ? Smokeless tobacco: Never  ?Vaping Use  ? Vaping Use: Never used  ?Substance and Sexual Activity  ? Alcohol use: No  ?  Alcohol/week: 0.0 standard drinks  ? Drug use: No  ? Sexual activity: Yes  ?  Partners: Male  ?  Birth control/protection: Pill  ?Other  Topics Concern  ? Not on file  ?Social History Narrative  ? ** Merged History Encounter **  ?    ? ** Merged History Encounter **  ?    ? ?Social Determinants of Health  ? ?Financial Resource Strain: Not on file  ?Food Insecurity: Not on file  ?Transportation Needs: Not on file  ?Physical Activity: Not on file  ?Stress: Not on file  ?Social Connections: Not on file  ?Intimate Partner Violence: Not on file  ? ? ?Family History  ?Problem Relation Age of Onset  ? Anuerysm Mother   ? Hypertension Father   ? ? ? ?Review of Systems  ?Constitutional: Negative.  Negative for chills and fever.  ?HENT: Negative.  Negative for congestion and sore throat.   ?Respiratory: Negative.  Negative for cough and shortness of breath.   ?Cardiovascular: Negative.  Negative for chest pain and palpitations.  ?Gastrointestinal:  Negative for abdominal pain, nausea and vomiting.  ?Skin: Negative.  Negative for rash.  ?Neurological: Negative.  Negative for dizziness and headaches.  ?All other systems reviewed and are negative. ? ?Today's Vitals  ? 05/13/21 1604  ?BP: (!) 136/94  ?Pulse: 78  ?Temp: 98.7 ?F (37.1 ?C)  ?SpO2: 99%  ?Weight: 218 lb 8 oz (99.1 kg)  ?Height: 5\' 7"  (1.702 m)  ? ?Body mass index is 34.22 kg/m? ? ?  Physical Exam ?Vitals reviewed.  ?Constitutional:   ?   Appearance: Normal appearance.  ?HENT:  ?   Head: Normocephalic.  ?Eyes:  ?   Extraocular Movements: Extraocular movements intact.  ?   Conjunctiva/sclera: Conjunctivae normal.  ?   Pupils: Pupils are equal, round, and reactive to light.  ?Cardiovascular:  ?   Rate and Rhythm: Normal rate and regular rhythm.  ?   Pulses: Normal pulses.  ?   Heart sounds: Normal heart sounds.  ?Pulmonary:  ?   Effort: Pulmonary effort is normal.  ?   Breath sounds: Normal breath sounds.  ?Abdominal:  ?   General: There is no distension.  ?   Palpations: Abdomen is soft.  ?   Tenderness: There is no abdominal tenderness.  ?Musculoskeletal:     ?   General: Normal range of motion.  ?    Cervical back: No tenderness.  ?   Right lower leg: No edema.  ?   Left lower leg: No edema.  ?Lymphadenopathy:  ?   Cervical: No cervical adenopathy.  ?Skin: ?   General: Skin is warm and dry.  ?   Capillary Refill: Capillary refill takes less than 2 seconds.  ?Neurological:  ?   General: No focal deficit present.  ?   Mental Status: She is alert and oriented to person, place, and time.  ?Psychiatric:     ?   Mood and Affect: Mood normal.     ?   Behavior: Behavior normal.  ? ? ? ?ASSESSMENT & PLAN: ?Problem List Items Addressed This Visit   ? ?  ? Cardiovascular and Mediastinum  ? Essential hypertension - Primary  ?  Well-controlled hypertension with normal blood pressure readings at home. ?BP Readings from Last 3 Encounters:  ?05/13/21 130/70  ?12/06/20 (!) 162/92  ?11/07/20 138/70  ?Continue amlodipine 5 mg and hydrochlorothiazide 12.5 mg daily. ?Dietary approaches to stop hypertension discussed. ? ?  ?  ? Relevant Medications  ? amLODipine (NORVASC) 5 MG tablet  ?  ? Other  ? Prediabetes  ?  Diet and nutrition discussed.  Advised to decrease amount of daily carbohydrate intake.  Cardiovascular risk associated with diabetes and hypertension discussed. ?  ?  ? ?Patient Instructions  ?Health Maintenance, Female ?Adopting a healthy lifestyle and getting preventive care are important in promoting health and wellness. Ask your health care provider about: ?The right schedule for you to have regular tests and exams. ?Things you can do on your own to prevent diseases and keep yourself healthy. ?What should I know about diet, weight, and exercise? ?Eat a healthy diet ? ?Eat a diet that includes plenty of vegetables, fruits, low-fat dairy products, and lean protein. ?Do not eat a lot of foods that are high in solid fats, added sugars, or sodium. ?Maintain a healthy weight ?Body mass index (BMI) is used to identify weight problems. It estimates body fat based on height and weight. Your health care provider can help  determine your BMI and help you achieve or maintain a healthy weight. ?Get regular exercise ?Get regular exercise. This is one of the most important things you can do for your health. Most adults should: ?Exercise for at least 150 minutes each week. The exercise should increase your heart rate and make you sweat (moderate-intensity exercise). ?Do strengthening exercises at least twice a week. This is in addition to the moderate-intensity exercise. ?Spend less time sitting. Even light physical activity can be beneficial. ?Watch cholesterol and blood lipids ?Have  your blood tested for lipids and cholesterol at 39 years of age, then have this test every 5 years. ?Have your cholesterol levels checked more often if: ?Your lipid or cholesterol levels are high. ?You are older than 39 years of age. ?You are at high risk for heart disease. ?What should I know about cancer screening? ?Depending on your health history and family history, you may need to have cancer screening at various ages. This may include screening for: ?Breast cancer. ?Cervical cancer. ?Colorectal cancer. ?Skin cancer. ?Lung cancer. ?What should I know about heart disease, diabetes, and high blood pressure? ?Blood pressure and heart disease ?High blood pressure causes heart disease and increases the risk of stroke. This is more likely to develop in people who have high blood pressure readings or are overweight. ?Have your blood pressure checked: ?Every 3-5 years if you are 7818-39 years of age. ?Every year if you are 39 years old or older. ?Diabetes ?Have regular diabetes screenings. This checks your fasting blood sugar level. Have the screening done: ?Once every three years after age 39 if you are at a normal weight and have a low risk for diabetes. ?More often and at a younger age if you are overweight or have a high risk for diabetes. ?What should I know about preventing infection? ?Hepatitis B ?If you have a higher risk for hepatitis B, you should be  screened for this virus. Talk with your health care provider to find out if you are at risk for hepatitis B infection. ?Hepatitis C ?Testing is recommended for: ?Everyone born from [birth year] through 1963. ?Anyone with

## 2021-05-13 NOTE — Assessment & Plan Note (Addendum)
Diet and nutrition discussed.  Advised to decrease amount of daily carbohydrate intake.  Cardiovascular risk associated with diabetes and hypertension discussed. ?

## 2021-05-13 NOTE — Patient Instructions (Signed)

## 2021-05-13 NOTE — Assessment & Plan Note (Signed)
Well-controlled hypertension with normal blood pressure readings at home. ?BP Readings from Last 3 Encounters:  ?05/13/21 130/70  ?12/06/20 (!) 162/92  ?11/07/20 138/70  ?Continue amlodipine 5 mg and hydrochlorothiazide 12.5 mg daily. ?Dietary approaches to stop hypertension discussed. ? ?

## 2021-06-09 ENCOUNTER — Ambulatory Visit: Payer: BC Managed Care – PPO

## 2021-06-10 ENCOUNTER — Ambulatory Visit: Payer: BC Managed Care – PPO

## 2021-06-12 ENCOUNTER — Telehealth: Payer: Self-pay | Admitting: *Deleted

## 2021-06-12 NOTE — Telephone Encounter (Signed)
Pt called to office stating her urine has been cloudy and she is having some "flank"pain. ?Pt has nurse visit in office next week. ?Pt advised that she should be eval for UTI - keep appt next week or be seen at urgent care.  ?Pt states problems has been going on for some time, rating pain 4 out of 10. ?Pt states hx of ovarian cyst.  Pt made aware she may need appt with provider for further exam. ?Pt has AEX appt at end of month. Pt advised she should still be seen for any UTI issues and if pain is severe to see urgent care.  ? ?

## 2021-06-17 ENCOUNTER — Other Ambulatory Visit: Payer: Self-pay | Admitting: Advanced Practice Midwife

## 2021-06-17 ENCOUNTER — Ambulatory Visit (INDEPENDENT_AMBULATORY_CARE_PROVIDER_SITE_OTHER): Payer: BC Managed Care – PPO

## 2021-06-17 VITALS — BP 142/89 | HR 66 | Ht 67.0 in | Wt 216.0 lb

## 2021-06-17 DIAGNOSIS — N898 Other specified noninflammatory disorders of vagina: Secondary | ICD-10-CM

## 2021-06-17 DIAGNOSIS — N3001 Acute cystitis with hematuria: Secondary | ICD-10-CM

## 2021-06-17 DIAGNOSIS — R350 Frequency of micturition: Secondary | ICD-10-CM

## 2021-06-17 DIAGNOSIS — R300 Dysuria: Secondary | ICD-10-CM | POA: Diagnosis not present

## 2021-06-17 LAB — POCT URINALYSIS DIPSTICK
Bilirubin, UA: NEGATIVE
Blood, UA: POSITIVE
Glucose, UA: NEGATIVE
Ketones, UA: NEGATIVE
Nitrite, UA: NEGATIVE
Protein, UA: NEGATIVE
Spec Grav, UA: 1.015 (ref 1.010–1.025)
Urobilinogen, UA: 0.2 E.U./dL
pH, UA: 7 (ref 5.0–8.0)

## 2021-06-17 MED ORDER — SULFAMETHOXAZOLE-TRIMETHOPRIM 800-160 MG PO TABS: 1.0000 | ORAL_TABLET | Freq: Two times a day (BID) | ORAL | 0 refills | Status: AC

## 2021-06-17 NOTE — Progress Notes (Signed)
SUBJECTIVE:  ?41 y.o. female complains of copious vaginal discharge, abdominal pain and dysuria for 5 day(s). ?Denies abnormal vaginal bleeding or significant pelvic pain or ?fever.  Denies history of known exposure to STD. ? ?Patient's last menstrual period was 05/29/2021 (approximate). ? ?OBJECTIVE:  ?She appears well, afebrile. ?Urine dipstick: positive for RBC's and positive for leukocytes. ? ?ASSESSMENT:  ?Vaginal Discharge  ?Vaginal Odor ?Dysuria ? ? ?PLAN:  ?GC, chlamydia, trichomonas, BVAG, CVAG probe and Urine Culture sent to lab. ?Treatment: To be determined once lab results are received ?ROV prn if symptoms persist or worsen.  ?

## 2021-06-18 NOTE — Progress Notes (Signed)
I have reviewed the chart and agree with the nurse's note and plan of care for this visit.  ? ?Sharen Counter, CNM ?2:29 PM  ?

## 2021-06-19 LAB — CERVICOVAGINAL ANCILLARY ONLY
Bacterial Vaginitis (gardnerella): NEGATIVE
Candida Glabrata: NEGATIVE
Candida Vaginitis: NEGATIVE
Chlamydia: NEGATIVE
Comment: NEGATIVE
Comment: NEGATIVE
Comment: NEGATIVE
Comment: NEGATIVE
Comment: NEGATIVE
Comment: NORMAL
Neisseria Gonorrhea: NEGATIVE
Trichomonas: NEGATIVE

## 2021-06-19 LAB — URINE CULTURE

## 2021-07-09 ENCOUNTER — Other Ambulatory Visit (HOSPITAL_COMMUNITY)
Admission: RE | Admit: 2021-07-09 | Discharge: 2021-07-09 | Disposition: A | Discharge status: Home / Self Care | Payer: BC Managed Care – PPO | Source: Ambulatory Visit | Attending: Obstetrics and Gynecology | Admitting: Obstetrics and Gynecology

## 2021-07-09 ENCOUNTER — Encounter: Payer: Self-pay | Admitting: Obstetrics and Gynecology

## 2021-07-09 ENCOUNTER — Ambulatory Visit (INDEPENDENT_AMBULATORY_CARE_PROVIDER_SITE_OTHER): Payer: BC Managed Care – PPO | Admitting: Obstetrics and Gynecology

## 2021-07-09 DIAGNOSIS — Z113 Encounter for screening for infections with a predominantly sexual mode of transmission: Secondary | ICD-10-CM | POA: Diagnosis present

## 2021-07-09 DIAGNOSIS — Z3041 Encounter for surveillance of contraceptive pills: Secondary | ICD-10-CM

## 2021-07-09 DIAGNOSIS — Z202 Contact with and (suspected) exposure to infections with a predominantly sexual mode of transmission: Secondary | ICD-10-CM | POA: Insufficient documentation

## 2021-07-09 DIAGNOSIS — Z01419 Encounter for gynecological examination (general) (routine) without abnormal findings: Secondary | ICD-10-CM

## 2021-07-09 MED ORDER — NORTREL 1/35 (28) 1-35 MG-MCG PO TABS: 1.0000 | ORAL_TABLET | Freq: Every day | ORAL | 4 refills | Status: DC

## 2021-07-09 NOTE — Patient Instructions (Signed)

## 2021-07-09 NOTE — Progress Notes (Signed)
Patient presents for AEX. Patient request to have STD testing along with blood work. She would also like to be checked for yeast and bv.  Last Pap: 06/17/2020 Normal

## 2021-07-09 NOTE — Progress Notes (Signed)
Carla Morrow is a 41 y.o. [phone] female here for a routine annual gynecologic exam.  Current complaints: Desires STD tetsing.   Denies abnormal vaginal bleeding, discharge, pelvic pain, problems with intercourse or other gynecologic concerns.    Gynecologic History Patient's last menstrual period was 06/22/2021. Contraception: OCP (estrogen/progesterone) Last Pap: 24. Results were: normal Last mammogram: NA.  Obstetric History OB History  Gravida Para Term Preterm AB Living  4 4 3 1  0 4  SAB IAB Ectopic Multiple Live Births  0 0 0   4    # Outcome Date GA Lbr Len/2nd Weight Sex Delivery Anes PTL Lv  4 Term 07/07/15 [redacted]w[redacted]d 08:45 3.035 kg M Vag-Spont None  LIV  3 Preterm 09/27/11 [redacted]w[redacted]d 08:26 / 00:11 2.696 kg M Vag-Spont None  LIV     Birth Comments: WNL  2 Term 03/24/03 [redacted]w[redacted]d  3.062 kg F Vag-Spont EPI N LIV  1 Term 04/20/97 [redacted]w[redacted]d  3.402 kg F Vag-Spont EPI N LIV    Past Medical History:  Diagnosis Date   Hypertension    Medical history non-contributory    No pertinent past medical history     Past Surgical History:  Procedure Laterality Date   NO PAST SURGERIES      Current Outpatient Medications on File Prior to Visit  Medication Sig Dispense Refill   Adapalene 0.3 % gel 1 application in the evening     amLODipine (NORVASC) 5 MG tablet Take 1 tablet (5 mg total) by mouth daily. 90 tablet 3   hydrochlorothiazide (HYDRODIURIL) 12.5 MG tablet Take 1 tablet (12.5 mg total) by mouth daily. 90 tablet 3   NORTREL 1/35, 28, tablet TAKE 1 TABLET BY MOUTH DAILY 84 tablet 4   prenatal vitamin w/FE, FA (PRENATAL 1 + 1) 27-1 MG TABS tablet Take 1 tablet by mouth daily before breakfast. 30 tablet 11   No current facility-administered medications on file prior to visit.    No Known Allergies  Social History   Socioeconomic History   Marital status: Significant Other    Spouse name: Not on file   Number of children: Not on file   Years of education: Not on file   Highest  education level: Not on file  Occupational History   Not on file  Tobacco Use   Smoking status: Never   Smokeless tobacco: Never  Vaping Use   Vaping Use: Never used  Substance and Sexual Activity   Alcohol use: No    Alcohol/week: 0.0 standard drinks   Drug use: No   Sexual activity: Yes    Partners: Male    Birth control/protection: Pill  Other Topics Concern   Not on file  Social History Narrative   ** Merged History Encounter **       ** Merged History Encounter **       Social Determinants of Health   Financial Resource Strain: Not on file  Food Insecurity: Not on file  Transportation Needs: Not on file  Physical Activity: Not on file  Stress: Not on file  Social Connections: Not on file  Intimate Partner Violence: Not on file    Family History  Problem Relation Age of Onset   Anuerysm Mother    Hypertension Father     The following portions of the patient's history were reviewed and updated as appropriate: allergies, current medications, past family history, past medical history, past social history, past surgical history and problem list.  Review of Systems Pertinent items noted in HPI  and remainder of comprehensive ROS otherwise negative.   Objective:  BP (!) 146/99   Pulse 71   Ht 5\' 6"  (1.676 m)   Wt 96.1 kg   LMP 06/22/2021   BMI 34.20 kg/m  CONSTITUTIONAL: Well-developed, well-nourished female in no acute distress.  HENT:  Normocephalic, atraumatic, External right and left ear normal. Oropharynx is clear and moist EYES: Conjunctivae and EOM are normal. Pupils are equal, round, and reactive to light. No scleral icterus.  NECK: Normal range of motion, supple, no masses.  Normal thyroid.  SKIN: Skin is warm and dry. No rash noted. Not diaphoretic. No erythema. No pallor. [place]: Alert and oriented to person, place, and time. Normal reflexes, muscle tone coordination. No cranial nerve deficit noted. PSYCHIATRIC: Normal mood and affect. Normal  behavior. Normal judgment and thought content. CARDIOVASCULAR: Normal heart rate noted, regular rhythm RESPIRATORY: Clear to auscultation bilaterally. Effort and breath sounds normal, no problems with respiration noted. BREASTS: Symmetric in size. No masses, skin changes, nipple drainage, or lymphadenopathy. ABDOMEN: Soft, normal bowel sounds, no distention noted.  No tenderness, rebound or guarding.  PELVIC: Normal appearing external genitalia;  Normal uterine size, no other palpable masses, no uterine or adnexal tenderness. MUSCULOSKELETAL: Normal range of motion. No tenderness.  No cyanosis, clubbing, or edema.  2+ distal pulses.   Assessment:  Annual gynecologic examination with pap smear  STD testing Contraceptive management Plan:  Will follow up results of pap smear and manage accordingly. STD test as per pt request. Continue with OCP's Routine preventative health maintenance measures emphasized. Please refer to After Visit Summary for other counseling recommendations.    Chancy Milroy, MD, [place] Attending [place] for [facility], [place]

## 2021-07-10 LAB — CERVICOVAGINAL ANCILLARY ONLY
Bacterial Vaginitis (gardnerella): NEGATIVE
Candida Glabrata: NEGATIVE
Candida Vaginitis: NEGATIVE
Chlamydia: NEGATIVE
Comment: NEGATIVE
Comment: NEGATIVE
Comment: NEGATIVE
Comment: NEGATIVE
Comment: NEGATIVE
Comment: NORMAL
Neisseria Gonorrhea: NEGATIVE
Trichomonas: NEGATIVE

## 2021-07-10 LAB — HIV ANTIBODY (ROUTINE TESTING W REFLEX): HIV Screen 4th Generation wRfx: NONREACTIVE

## 2021-07-10 LAB — HEPATITIS C ANTIBODY: Hep C Virus Ab: NONREACTIVE

## 2021-07-10 LAB — RPR: RPR Ser Ql: NONREACTIVE

## 2021-07-10 LAB — HEPATITIS B SURFACE ANTIGEN: Hepatitis B Surface Ag: NEGATIVE

## 2021-11-12 ENCOUNTER — Ambulatory Visit: Payer: BC Managed Care – PPO | Admitting: Emergency Medicine

## 2021-12-01 ENCOUNTER — Encounter: Payer: Self-pay | Admitting: *Deleted

## 2021-12-02 ENCOUNTER — Encounter: Payer: Self-pay | Admitting: Emergency Medicine

## 2021-12-02 ENCOUNTER — Ambulatory Visit: Payer: BC Managed Care – PPO | Admitting: Emergency Medicine

## 2021-12-02 ENCOUNTER — Ambulatory Visit (INDEPENDENT_AMBULATORY_CARE_PROVIDER_SITE_OTHER): Payer: BC Managed Care – PPO

## 2021-12-02 VITALS — BP 138/88 | HR 61 | Temp 97.6°F | Ht 66.0 in | Wt 224.0 lb

## 2021-12-02 DIAGNOSIS — R7303 Prediabetes: Secondary | ICD-10-CM

## 2021-12-02 DIAGNOSIS — M549 Dorsalgia, unspecified: Secondary | ICD-10-CM | POA: Diagnosis not present

## 2021-12-02 DIAGNOSIS — M62838 Other muscle spasm: Secondary | ICD-10-CM | POA: Diagnosis not present

## 2021-12-02 DIAGNOSIS — I10 Essential (primary) hypertension: Secondary | ICD-10-CM | POA: Diagnosis not present

## 2021-12-02 MED ORDER — CYCLOBENZAPRINE HCL 10 MG PO TABS: 10.0000 mg | ORAL_TABLET | Freq: Every day | ORAL | 1 refills | Status: DC

## 2021-12-02 MED ORDER — AMLODIPINE BESYLATE 5 MG PO TABS: 5.0000 mg | ORAL_TABLET | Freq: Every day | ORAL | 3 refills | Status: DC

## 2021-12-02 NOTE — Assessment & Plan Note (Signed)
Pain management discussed. Need to pay attention to day-to-day activities Advised to take Advil dual action for pain.

## 2021-12-02 NOTE — Assessment & Plan Note (Signed)
Very tender and affecting quality of life. Will benefit from Flexeril 10 mg at bedtime Heat pad after work

## 2021-12-02 NOTE — Patient Instructions (Signed)
Muscle Cramps and Spasms Muscle cramps and spasms are when muscles tighten by themselves. They usually get better within minutes. Muscle cramps are painful. They are usually stronger and last longer than muscle spasms. Muscle spasms may or may not be painful. They can last a few seconds or much longer. Cramps and spasms can affect any muscle, but they occur most often in the calf muscles of the leg. They are usually not caused by a serious problem. In many cases, the cause is not known. Some common causes include: Doing more physical work or exercise than your body is ready for. Using the muscles too much (overuse) by repeating certain movements too many times. Staying in a certain position for a long time. Playing a sport or doing an activity without preparing properly. Using bad form or technique while playing a sport or doing an activity. Not having enough water in your body (dehydration). Injury. Side effects of some medicines. Low levels of the salts and minerals in your blood (electrolytes), such as low potassium or calcium. Follow these instructions at home: Managing pain and stiffness     Massage, stretch, and relax the muscle. Do this for many minutes at a time. If told, put heat on tight or tense muscles as often as told by your doctor. Use the heat source that your doctor recommends, such as a moist heat pack or a heating pad. Place a towel between your skin and the heat source. Leave the heat on for 20-30 minutes. Remove the heat if your skin turns bright red. This is very important if you are not able to feel pain, heat, or cold. You may have a greater risk of getting burned. If told, put ice on the affected area. This may help if you are sore or have pain after a cramp or spasm. Put ice in a plastic bag. Place a towel between your skin and the bag. Leave the ice on for 20 minutes, 2-3 times a day. Try taking hot showers or baths to help relax tight muscles. Eating and  drinking Drink enough fluid to keep your pee (urine) pale yellow. Eat a healthy diet to help ensure that your muscles work well. This should include: Fruits and vegetables. Lean protein. Whole grains. Low-fat or nonfat dairy products. General instructions If you are having cramps often, avoid intense exercise for several days. Take over-the-counter and prescription medicines only as told by your doctor. Watch for any changes in your symptoms. Keep all follow-up visits as told by your doctor. This is important. Contact a doctor if: Your cramps or spasms get worse or happen more often. Your cramps or spasms do not get better with time. Summary Muscle cramps and spasms are when muscles tighten by themselves. They usually get better within minutes. Cramps and spasms occur most often in the calf muscles of the leg. Massage, stretch, and relax the muscle. This may help the cramp or spasm go away. Drink enough fluid to keep your pee (urine) pale yellow. This information is not intended to replace advice given to you by your health care provider. Make sure you discuss any questions you have with your health care provider. Document Revised: 08/16/2020 Document Reviewed: 08/16/2020 Elsevier Patient Education  2023 Elsevier Inc.  

## 2021-12-02 NOTE — Assessment & Plan Note (Signed)
Well-controlled hypertension. Continue amlodipine 5 mg and hydrochlorothiazide 12.5 mg daily.

## 2021-12-02 NOTE — Progress Notes (Signed)
Carla Morrow 41 y.o.   Chief Complaint  Patient presents with   Back Pain    Right shoulder, arm pain, rt side of back, constant pain     HISTORY OF PRESENT ILLNESS: This is a 41 y.o. female complaining of pain to right shoulder blade area that started about a month ago progressively getting worse over the last couple days.  Denies injury.  Denies associated symptoms.  History of hypertension. No other complaints or medical concerns today.  Back Pain Pertinent negatives include no abdominal pain, chest pain, fever or headaches.     Prior to Admission medications   Medication Sig Start Date End Date Taking? Authorizing Provider  Adapalene 0.3 % gel 1 application in the evening   Yes [provider]  hydrochlorothiazide (HYDRODIURIL) 12.5 MG tablet Take 1 tablet (12.5 mg total) by mouth daily. 11/07/20  Yes Horald Pollen, MD  norethindrone-ethinyl estradiol 1/35 (NORTREL 1/35, 28,) tablet Take 1 tablet by mouth daily. 07/09/21  Yes Chancy Milroy, MD  prenatal vitamin w/FE, FA (PRENATAL 1 + 1) 27-1 MG TABS tablet Take 1 tablet by mouth daily before breakfast. 06/17/20  Yes Shelly Bombard, MD  amLODipine (NORVASC) 5 MG tablet Take 1 tablet (5 mg total) by mouth daily. 05/13/21 08/11/21  Horald Pollen, MD    No Known Allergies  Patient Active Problem List   Diagnosis Date Noted   Visit for routine gyn exam 07/09/2021   Possible exposure to STD 07/09/2021   Prediabetes 05/13/2021   Body mass index (BMI) of 34.0-34.9 in adult 11/07/2020   Essential hypertension 11/07/2020   History of hypertension 02/08/2015    Past Medical History:  Diagnosis Date   Hypertension    Medical history non-contributory    No pertinent past medical history     Past Surgical History:  Procedure Laterality Date   NO PAST SURGERIES      Social History   Socioeconomic History   Marital status: Significant Other    Spouse name: Not on file   Number of children: Not  on file   Years of education: Not on file   Highest education level: Not on file  Occupational History   Not on file  Tobacco Use   Smoking status: Never   Smokeless tobacco: Never  Vaping Use   Vaping Use: Never used  Substance and Sexual Activity   Alcohol use: No    Alcohol/week: 0.0 standard drinks of alcohol   Drug use: No   Sexual activity: Yes    Partners: Male    Birth control/protection: Pill  Other Topics Concern   Not on file  Social History Narrative   ** Merged History Encounter **       ** Merged History Encounter **       Social Determinants of Health   Financial Resource Strain: Not on file  Food Insecurity: Not on file  Transportation Needs: Not on file  Physical Activity: Not on file  Stress: Not on file  Social Connections: Not on file  Intimate Partner Violence: Not on file    Family History  Problem Relation Age of Onset   Anuerysm Mother    Hypertension Father      Review of Systems  Constitutional: Negative.  Negative for chills and fever.  HENT: Negative.  Negative for congestion and sore throat.   Respiratory: Negative.  Negative for shortness of breath.   Cardiovascular: Negative.  Negative for chest pain and palpitations.  Gastrointestinal: Negative.  Negative for abdominal pain, diarrhea, nausea and vomiting.  Musculoskeletal:  Positive for back pain.  Skin: Negative.  Negative for rash.  Neurological: Negative.  Negative for dizziness and headaches.  All other systems reviewed and are negative.  Today's Vitals   12/02/21 0842  BP: 138/88  Pulse: 61  Temp: 97.6 F (36.4 C)  TempSrc: Oral  SpO2: 92%  Weight: 224 lb (101.6 kg)  Height: 5\' 6"  (1.676 m)   Body mass index is 36.15 kg/m.   Physical Exam Vitals reviewed.  Constitutional:      Appearance: Normal appearance.  HENT:     Head: Normocephalic.  Eyes:     Extraocular Movements: Extraocular movements intact.     Pupils: Pupils are equal, round, and reactive to  light.  Cardiovascular:     Rate and Rhythm: Normal rate and regular rhythm.     Pulses: Normal pulses.     Heart sounds: Normal heart sounds.  Pulmonary:     Effort: Pulmonary effort is normal.     Breath sounds: Normal breath sounds.  Musculoskeletal:     Cervical back: No tenderness.     Right lower leg: No edema.     Left lower leg: No edema.     Comments: Right trapezius muscle spasm with tenderness  Lymphadenopathy:     Cervical: No cervical adenopathy.  Skin:    General: Skin is warm and dry.     Capillary Refill: Capillary refill takes less than 2 seconds.  Neurological:     General: No focal deficit present.     Mental Status: She is alert and oriented to person, place, and time.  Psychiatric:        Mood and Affect: Mood normal.        Behavior: Behavior normal.   DG Chest 2 View  Result Date: 12/02/2021 CLINICAL DATA:  Right upper back pain. EXAM: CHEST - 2 VIEW COMPARISON:  None Available. FINDINGS: The heart size and mediastinal contours are within normal limits. Both lungs are clear. The visualized skeletal structures are unremarkable. IMPRESSION: Normal study. Electronically Signed   By: 12/04/2021 M.D.   On: 12/02/2021 09:17      ASSESSMENT & PLAN: A total of 40 minutes was spent with the patient and counseling/coordination of care regarding preparing for this visit, review of most recent office visit notes, diagnosis of trapezius muscle strain and spasm, pain management, use of muscle relaxants, prognosis, review of chest x-ray report, documentation, and need for follow-up.  Problem List Items Addressed This Visit       Cardiovascular and Mediastinum   Essential hypertension    Well-controlled hypertension. Continue amlodipine 5 mg and hydrochlorothiazide 12.5 mg daily.      Relevant Medications   amLODipine (NORVASC) 5 MG tablet     Musculoskeletal and Integument   Trapezius muscle spasm - Primary    Very tender and affecting quality of life. Will  benefit from Flexeril 10 mg at bedtime Heat pad after work       Relevant Medications   cyclobenzaprine (FLEXERIL) 10 MG tablet     Other   Prediabetes   Musculoskeletal back pain    Pain management discussed. Need to pay attention to day-to-day activities Advised to take Advil dual action for pain.      Relevant Medications   cyclobenzaprine (FLEXERIL) 10 MG tablet   Other Relevant Orders   DG Chest 2 View (Completed)   Patient Instructions  Muscle Cramps and Spasms Muscle cramps  and spasms are when muscles tighten by themselves. They usually get better within minutes. Muscle cramps are painful. They are usually stronger and last longer than muscle spasms. Muscle spasms may or may not be painful. They can last a few seconds or much longer. Cramps and spasms can affect any muscle, but they occur most often in the calf muscles of the leg. They are usually not caused by a serious problem. In many cases, the cause is not known. Some common causes include: Doing more physical work or exercise than your body is ready for. Using the muscles too much (overuse) by repeating certain movements too many times. Staying in a certain position for a long time. Playing a sport or doing an activity without preparing properly. Using bad form or technique while playing a sport or doing an activity. Not having enough water in your body (dehydration). Injury. Side effects of some medicines. Low levels of the salts and minerals in your blood (electrolytes), such as low potassium or calcium. Follow these instructions at home: Managing pain and stiffness     Massage, stretch, and relax the muscle. Do this for many minutes at a time. If told, put heat on tight or tense muscles as often as told by your doctor. Use the heat source that your doctor recommends, such as a moist heat pack or a heating pad. Place a towel between your skin and the heat source. Leave the heat on for 20-30 minutes. Remove  the heat if your skin turns bright red. This is very important if you are not able to feel pain, heat, or cold. You may have a greater risk of getting burned. If told, put ice on the affected area. This may help if you are sore or have pain after a cramp or spasm. Put ice in a plastic bag. Place a towel between your skin and the bag. Leave the ice on for 20 minutes, 2-3 times a day. Try taking hot showers or baths to help relax tight muscles. Eating and drinking Drink enough fluid to keep your pee (urine) pale yellow. Eat a healthy diet to help ensure that your muscles work well. This should include: Fruits and vegetables. Lean protein. Whole grains. Low-fat or nonfat dairy products. General instructions If you are having cramps often, avoid intense exercise for several days. Take over-the-counter and prescription medicines only as told by your doctor. Watch for any changes in your symptoms. Keep all follow-up visits as told by your doctor. This is important. Contact a doctor if: Your cramps or spasms get worse or happen more often. Your cramps or spasms do not get better with time. Summary Muscle cramps and spasms are when muscles tighten by themselves. They usually get better within minutes. Cramps and spasms occur most often in the calf muscles of the leg. Massage, stretch, and relax the muscle. This may help the cramp or spasm go away. Drink enough fluid to keep your pee (urine) pale yellow. This information is not intended to replace advice given to you by your health care provider. Make sure you discuss any questions you have with your health care provider. Document Revised: 08/16/2020 Document Reviewed: 08/16/2020 Elsevier Patient Education  2023 [employer] Inc.    Edwina Barth, MD [facility]

## 2021-12-10 ENCOUNTER — Ambulatory Visit: Payer: BC Managed Care – PPO | Admitting: Emergency Medicine

## 2021-12-31 ENCOUNTER — Ambulatory Visit: Payer: BC Managed Care – PPO | Admitting: Emergency Medicine

## 2022-02-05 ENCOUNTER — Ambulatory Visit: Payer: BC Managed Care – PPO | Admitting: Emergency Medicine

## 2022-06-08 ENCOUNTER — Ambulatory Visit (INDEPENDENT_AMBULATORY_CARE_PROVIDER_SITE_OTHER): Payer: BC Managed Care – PPO | Admitting: Emergency Medicine

## 2022-06-08 ENCOUNTER — Encounter: Payer: Self-pay | Admitting: Emergency Medicine

## 2022-06-08 VITALS — BP 136/84 | HR 67 | Temp 98.3°F | Ht 66.0 in | Wt 228.2 lb

## 2022-06-08 DIAGNOSIS — I10 Essential (primary) hypertension: Secondary | ICD-10-CM | POA: Diagnosis not present

## 2022-06-08 DIAGNOSIS — R7303 Prediabetes: Secondary | ICD-10-CM | POA: Diagnosis not present

## 2022-06-08 LAB — LIPID PANEL
Cholesterol: 151 mg/dL (ref 0–200)
HDL: 59.6 mg/dL (ref >39.00)
LDL Cholesterol: 62 mg/dL (ref 0–99)
NonHDL: 91.29
Total CHOL/HDL Ratio: 3
Triglycerides: 146 mg/dL (ref 0.0–149.0)
VLDL: 29.2 mg/dL (ref 0.0–40.0)

## 2022-06-08 LAB — COMPREHENSIVE METABOLIC PANEL
ALT: 12 U/L (ref 0–35)
AST: 14 U/L (ref 0–37)
Albumin: 3.8 g/dL (ref 3.5–5.2)
Alkaline Phosphatase: 63 U/L (ref 39–117)
BUN: 9 mg/dL (ref 6–23)
CO2: 28 mEq/L (ref 19–32)
Calcium: 8.6 mg/dL (ref 8.4–10.5)
Chloride: 101 mEq/L (ref 96–112)
Creatinine, Ser: 0.78 mg/dL (ref 0.40–1.20)
GFR: 95.55 mL/min (ref >60.00)
Glucose, Bld: 79 mg/dL (ref 70–99)
Potassium: 3.4 mEq/L — ABNORMAL LOW (ref 3.5–5.1)
Sodium: 137 mEq/L (ref 135–145)
Total Bilirubin: 0.3 mg/dL (ref 0.2–1.2)
Total Protein: 7.1 g/dL (ref 6.0–8.3)

## 2022-06-08 LAB — CBC WITH DIFFERENTIAL/PLATELET
Basophils Absolute: 0 10*3/uL (ref 0.0–0.1)
Basophils Relative: 0.4 % (ref 0.0–3.0)
Eosinophils Absolute: 0.1 10*3/uL (ref 0.0–0.7)
Eosinophils Relative: 1.6 % (ref 0.0–5.0)
HCT: 39.7 % (ref 36.0–46.0)
Hemoglobin: 13.1 g/dL (ref 12.0–15.0)
Lymphocytes Relative: 26.5 % (ref 12.0–46.0)
Lymphs Abs: 2.4 10*3/uL (ref 0.7–4.0)
MCHC: 33 g/dL (ref 30.0–36.0)
MCV: 83.8 fl (ref 78.0–100.0)
Monocytes Absolute: 0.7 10*3/uL (ref 0.1–1.0)
Monocytes Relative: 7.4 % (ref 3.0–12.0)
Neutro Abs: 5.9 10*3/uL (ref 1.4–7.7)
Neutrophils Relative %: 64.1 % (ref 43.0–77.0)
Platelets: 238 10*3/uL (ref 150.0–400.0)
RBC: 4.74 Mil/uL (ref 3.87–5.11)
RDW: 13.9 % (ref 11.5–15.5)
WBC: 9.1 10*3/uL (ref 4.0–10.5)

## 2022-06-08 LAB — HEMOGLOBIN A1C: Hgb A1c MFr Bld: 6.1 % (ref 4.6–6.5)

## 2022-06-08 NOTE — Progress Notes (Unsigned)
Carla Morrow 42 y.o.   Chief Complaint  Patient presents with   Medical Management of Chronic Issues    f/u appt, no concerns     HISTORY OF PRESENT ILLNESS: This is a 42 y.o. female here for 3-month follow-up of chronic medical conditions Overall doing well.  Has no complaints or medical concerns today. BP Readings from Last 3 Encounters:  06/08/22 136/84  12/02/21 138/88  07/09/21 (!) 146/99   Wt Readings from Last 3 Encounters:  06/08/22 228 lb 4 oz (103.5 kg)  12/02/21 224 lb (101.6 kg)  07/09/21 211 lb 14.4 oz (96.1 kg)     HPI   Prior to Admission medications   Medication Sig Start Date End Date Taking? Authorizing Provider  amLODipine (NORVASC) 5 MG tablet Take 1 tablet (5 mg total) by mouth daily. 12/02/21 11/27/22 Yes Calton Harshfield, Eilleen Kempf, MD  hydrochlorothiazide (HYDRODIURIL) 12.5 MG tablet Take 1 tablet (12.5 mg total) by mouth daily. 11/07/20  Yes Georgina Quint, MD  norethindrone-ethinyl estradiol 1/35 (NORTREL 1/35, 28,) tablet Take 1 tablet by mouth daily. 07/09/21  Yes Hermina Staggers, MD  prenatal vitamin w/FE, FA (PRENATAL 1 + 1) 27-1 MG TABS tablet Take 1 tablet by mouth daily before breakfast. 06/17/20  Yes Brock Bad, MD  Adapalene 0.3 % gel 1 application in the evening    [provider]  cyclobenzaprine (FLEXERIL) 10 MG tablet Take 1 tablet (10 mg total) by mouth at bedtime. Patient not taking: Reported on 06/08/2022 12/02/21   Georgina Quint, MD    No Known Allergies  Patient Active Problem List   Diagnosis Date Noted   Prediabetes 05/13/2021   Body mass index (BMI) of 34.0-34.9 in adult 11/07/2020   Essential hypertension 11/07/2020    Past Medical History:  Diagnosis Date   Hypertension    Medical history non-contributory    No pertinent past medical history     Past Surgical History:  Procedure Laterality Date   NO PAST SURGERIES      Social History   Socioeconomic History   Marital status:  Significant Other    Spouse name: Not on file   Number of children: Not on file   Years of education: Not on file   Highest education level: Bachelor's degree (e.g., BA, AB, BS)  Occupational History   Not on file  Tobacco Use   Smoking status: Never   Smokeless tobacco: Never  Vaping Use   Vaping Use: Never used  Substance and Sexual Activity   Alcohol use: No    Alcohol/week: 0.0 standard drinks of alcohol   Drug use: No   Sexual activity: Yes    Partners: Male    Birth control/protection: Pill  Other Topics Concern   Not on file  Social History Narrative   ** Merged History Encounter **       ** Merged History Encounter **       Social Determinants of Health   Financial Resource Strain: Low Risk  (06/07/2022)   Overall Financial Resource Strain (CARDIA)    Difficulty of Paying Living Expenses: Not very hard  Food Insecurity: No Food Insecurity (06/07/2022)   Hunger Vital Sign    Worried About Running Out of Food in the Last Year: Never true    Ran Out of Food in the Last Year: Never true  Transportation Needs: No Transportation Needs (06/07/2022)   PRAPARE - Transportation    Lack of Transportation (Medical): No    Lack of  Transportation (Non-Medical): No  Physical Activity: Sufficiently Active (06/07/2022)   Exercise Vital Sign    Days of Exercise per Week: 4 days    Minutes of Exercise per Session: 40 min  Stress: No Stress Concern Present (06/07/2022)   [employer] of Occupational Health - Occupational Stress Questionnaire    Feeling of Stress : Not at all  Social Connections: Unknown (06/07/2022)   Social Connection and Isolation Panel [NHANES]    Frequency of Communication with Friends and Family: More than three times a week    Frequency of Social Gatherings with Friends and Family: More than three times a week    Attends Religious Services: More than 4 times per year    Active Member of [employer] or Organizations: Patient declined    Attends Museum/gallery exhibitions officer: Not on file    Marital Status: Living with partner  Intimate Partner Violence: Not on file    Family History  Problem Relation Age of Onset   Anuerysm Mother    Hypertension Father      Review of Systems  Constitutional: Negative.  Negative for chills and fever.  HENT: Negative.  Negative for congestion and sore throat.   Respiratory: Negative.  Negative for cough and shortness of breath.   Cardiovascular: Negative.  Negative for chest pain and palpitations.  Gastrointestinal:  Negative for abdominal pain, diarrhea, nausea and vomiting.  Genitourinary: Negative.  Negative for dysuria and hematuria.  Skin: Negative.  Negative for rash.  Neurological: Negative.  Negative for dizziness and headaches.  All other systems reviewed and are negative.   Vitals:   06/08/22 1434  BP: 136/84  Pulse: 67  Temp: 98.3 F (36.8 C)  SpO2: 97%    Physical Exam Vitals reviewed.  Constitutional:      Appearance: Normal appearance.  HENT:     Head: Normocephalic.     Mouth/Throat:     Mouth: Mucous membranes are moist.     Pharynx: Oropharynx is clear.  Eyes:     Extraocular Movements: Extraocular movements intact.     Conjunctiva/sclera: Conjunctivae normal.     Pupils: Pupils are equal, round, and reactive to light.  Cardiovascular:     Rate and Rhythm: Normal rate and regular rhythm.     Pulses: Normal pulses.     Heart sounds: Normal heart sounds.  Pulmonary:     Effort: Pulmonary effort is normal.     Breath sounds: Normal breath sounds.  Musculoskeletal:     Cervical back: No tenderness.  Lymphadenopathy:     Cervical: No cervical adenopathy.  Skin:    General: Skin is warm and dry.     Capillary Refill: Capillary refill takes less than 2 seconds.  Neurological:     General: No focal deficit present.     Mental Status: She is alert and oriented to person, place, and time.  Psychiatric:        Mood and Affect: Mood normal.        Behavior:  Behavior normal.      ASSESSMENT & PLAN: A total of 42 minutes was spent with the patient and counseling/coordination of care regarding preparing for this visit, review of most recent office visit notes, cardiovascular risks associated with hypertension and prediabetes, review of chronic medical conditions under management, review of all medications, education on nutrition, prognosis, documentation, need for follow-up.  Problem List Items Addressed This Visit       Cardiovascular and Mediastinum   Essential hypertension - Primary  Well-controlled hypertension Normal blood pressure readings at home Continue amlodipine 5 mg daily and hydrochlorothiazide 12.5 mg daily Cardiovascular risk associated with hypertension discussed Dietary approaches to stop hypertension discussed Benefits of exercise discussed Follow-up in 6 months Blood work done today      Relevant Orders   CBC with Differential/Platelet   Comprehensive metabolic panel   Hemoglobin A1c   Lipid panel     Other   Prediabetes    Diet and nutrition discussed Hemoglobin A1c done today Advised to decrease amount of daily carbohydrate intake and daily calories and increase amount of plant-based protein in her diet      Relevant Orders   CBC with Differential/Platelet   Comprehensive metabolic panel   Hemoglobin A1c   Lipid panel   Patient Instructions  Hypertension, Adult High blood pressure (hypertension) is when the force of blood pumping through the arteries is too strong. The arteries are the blood vessels that carry blood from the heart throughout the body. Hypertension forces the heart to work harder to pump blood and may cause arteries to become narrow or stiff. Untreated or uncontrolled hypertension can lead to a heart attack, heart failure, a stroke, kidney disease, and other problems. A blood pressure reading consists of a higher number over a lower number. Ideally, your blood pressure should be below  120/80. The first ("top") number is called the systolic pressure. It is a measure of the pressure in your arteries as your heart beats. The second ("bottom") number is called the diastolic pressure. It is a measure of the pressure in your arteries as the heart relaxes. What are the causes? The exact cause of this condition is not known. There are some conditions that result in high blood pressure. What increases the risk? Certain factors may make you more likely to develop high blood pressure. Some of these risk factors are under your control, including: Smoking. Not getting enough exercise or physical activity. Being overweight. Having too much fat, sugar, calories, or salt (sodium) in your diet. Drinking too much alcohol. Other risk factors include: Having a personal history of heart disease, diabetes, high cholesterol, or kidney disease. Stress. Having a family history of high blood pressure and high cholesterol. Having obstructive sleep apnea. Age. The risk increases with age. What are the signs or symptoms? High blood pressure may not cause symptoms. Very high blood pressure (hypertensive crisis) may cause: Headache. Fast or irregular heartbeats (palpitations). Shortness of breath. Nosebleed. Nausea and vomiting. Vision changes. Severe chest pain, dizziness, and seizures. How is this diagnosed? This condition is diagnosed by measuring your blood pressure while you are seated, with your arm resting on a flat surface, your legs uncrossed, and your feet flat on the floor. The cuff of the blood pressure monitor will be placed directly against the skin of your upper arm at the level of your heart. Blood pressure should be measured at least twice using the same arm. Certain conditions can cause a difference in blood pressure between your right and left arms. If you have a high blood pressure reading during one visit or you have normal blood pressure with other risk factors, you may be asked  to: Return on a different day to have your blood pressure checked again. Monitor your blood pressure at home for 1 week or longer. If you are diagnosed with hypertension, you may have other blood or imaging tests to help your health care provider understand your overall risk for other conditions. How is this treated? This  condition is treated by making healthy lifestyle changes, such as eating healthy foods, exercising more, and reducing your alcohol intake. You may be referred for counseling on a healthy diet and physical activity. Your health care provider may prescribe medicine if lifestyle changes are not enough to get your blood pressure under control and if: Your systolic blood pressure is above 130. Your diastolic blood pressure is above 80. Your personal target blood pressure may vary depending on your medical conditions, your age, and other factors. Follow these instructions at home: Eating and drinking  Eat a diet that is high in fiber and potassium, and low in sodium, added sugar, and fat. An example of this eating plan is called the DASH diet. DASH stands for Dietary Approaches to Stop Hypertension. To eat this way: Eat plenty of fresh fruits and vegetables. Try to fill one half of your plate at each meal with fruits and vegetables. Eat whole grains, such as whole-wheat pasta, brown rice, or whole-grain bread. Fill about one fourth of your plate with whole grains. Eat or drink low-fat dairy products, such as skim milk or low-fat yogurt. Avoid fatty cuts of meat, processed or cured meats, and poultry with skin. Fill about one fourth of your plate with lean proteins, such as fish, chicken without skin, beans, eggs, or tofu. Avoid pre-made and processed foods. These tend to be higher in sodium, added sugar, and fat. Reduce your daily sodium intake. Many people with hypertension should eat less than 1,500 mg of sodium a day. Do not drink alcohol if: Your health care provider tells you  not to drink. You are pregnant, may be pregnant, or are planning to become pregnant. If you drink alcohol: Limit how much you have to: 0-1 drink a day for women. 0-2 drinks a day for men. Know how much alcohol is in your drink. In the [place] one drink equals one 12 oz bottle of beer (355 mL), one 5 oz glass of wine (148 mL), or one 1 oz glass of hard liquor (44 mL). Lifestyle  Work with your health care provider to maintain a healthy body weight or to lose weight. Ask what an ideal weight is for you. Get at least 30 minutes of exercise that causes your heart to beat faster (aerobic exercise) most days of the week. Activities may include walking, swimming, or biking. Include exercise to strengthen your muscles (resistance exercise), such as Pilates or lifting weights, as part of your weekly exercise routine. Try to do these types of exercises for 30 minutes at least 3 days a week. Do not use any products that contain nicotine or tobacco. These products include cigarettes, chewing tobacco, and vaping devices, such as e-cigarettes. If you need help quitting, ask your health care provider. Monitor your blood pressure at home as told by your health care provider. Keep all follow-up visits. This is important. Medicines Take over-the-counter and prescription medicines only as told by your health care provider. Follow directions carefully. Blood pressure medicines must be taken as prescribed. Do not skip doses of blood pressure medicine. Doing this puts you at risk for problems and can make the medicine less effective. Ask your health care provider about side effects or reactions to medicines that you should watch for. Contact a health care provider if you: Think you are having a reaction to a medicine you are taking. Have headaches that keep coming back (recurring). Feel dizzy. Have swelling in your ankles. Have trouble with your vision. Get help right away  if you: Develop a severe headache or  confusion. Have unusual weakness or numbness. Feel faint. Have severe pain in your chest or abdomen. Vomit repeatedly. Have trouble breathing. These symptoms may be an emergency. Get help right away. Call 911. Do not wait to see if the symptoms will go away. Do not drive yourself to the hospital. Summary Hypertension is when the force of blood pumping through your arteries is too strong. If this condition is not controlled, it may put you at risk for serious complications. Your personal target blood pressure may vary depending on your medical conditions, your age, and other factors. For most people, a normal blood pressure is less than 120/80. Hypertension is treated with lifestyle changes, medicines, or a combination of both. Lifestyle changes include losing weight, eating a healthy, low-sodium diet, exercising more, and limiting alcohol. This information is not intended to replace advice given to you by your health care provider. Make sure you discuss any questions you have with your health care provider. Document Revised: 12/03/2020 Document Reviewed: 12/03/2020 Elsevier Patient Education  2023 Elsevier Inc.    Edwina Barth, MD [facility]

## 2022-06-08 NOTE — Patient Instructions (Signed)
Hypertension, Adult High blood pressure (hypertension) is when the force of blood pumping through the arteries is too strong. The arteries are the blood vessels that carry blood from the heart throughout the body. Hypertension forces the heart to work harder to pump blood and may cause arteries to become narrow or stiff. Untreated or uncontrolled hypertension can lead to a heart attack, heart failure, a stroke, kidney disease, and other problems. A blood pressure reading consists of a higher number over a lower number. Ideally, your blood pressure should be below 120/80. The first ("top") number is called the systolic pressure. It is a measure of the pressure in your arteries as your heart beats. The second ("bottom") number is called the diastolic pressure. It is a measure of the pressure in your arteries as the heart relaxes. What are the causes? The exact cause of this condition is not known. There are some conditions that result in high blood pressure. What increases the risk? Certain factors may make you more likely to develop high blood pressure. Some of these risk factors are under your control, including: Smoking. Not getting enough exercise or physical activity. Being overweight. Having too much fat, sugar, calories, or salt (sodium) in your diet. Drinking too much alcohol. Other risk factors include: Having a personal history of heart disease, diabetes, high cholesterol, or kidney disease. Stress. Having a family history of high blood pressure and high cholesterol. Having obstructive sleep apnea. Age. The risk increases with age. What are the signs or symptoms? High blood pressure may not cause symptoms. Very high blood pressure (hypertensive crisis) may cause: Headache. Fast or irregular heartbeats (palpitations). Shortness of breath. Nosebleed. Nausea and vomiting. Vision changes. Severe chest pain, dizziness, and seizures. How is this diagnosed? This condition is diagnosed by  measuring your blood pressure while you are seated, with your arm resting on a flat surface, your legs uncrossed, and your feet flat on the floor. The cuff of the blood pressure monitor will be placed directly against the skin of your upper arm at the level of your heart. Blood pressure should be measured at least twice using the same arm. Certain conditions can cause a difference in blood pressure between your right and left arms. If you have a high blood pressure reading during one visit or you have normal blood pressure with other risk factors, you may be asked to: Return on a different day to have your blood pressure checked again. Monitor your blood pressure at home for 1 week or longer. If you are diagnosed with hypertension, you may have other blood or imaging tests to help your health care provider understand your overall risk for other conditions. How is this treated? This condition is treated by making healthy lifestyle changes, such as eating healthy foods, exercising more, and reducing your alcohol intake. You may be referred for counseling on a healthy diet and physical activity. Your health care provider may prescribe medicine if lifestyle changes are not enough to get your blood pressure under control and if: Your systolic blood pressure is above 130. Your diastolic blood pressure is above 80. Your personal target blood pressure may vary depending on your medical conditions, your age, and other factors. Follow these instructions at home: Eating and drinking  Eat a diet that is high in fiber and potassium, and low in sodium, added sugar, and fat. An example of this eating plan is called the DASH diet. DASH stands for Dietary Approaches to Stop Hypertension. To eat this way: Eat   plenty of fresh fruits and vegetables. Try to fill one half of your plate at each meal with fruits and vegetables. Eat whole grains, such as whole-wheat pasta, brown rice, or whole-grain bread. Fill about one  fourth of your plate with whole grains. Eat or drink low-fat dairy products, such as skim milk or low-fat yogurt. Avoid fatty cuts of meat, processed or cured meats, and poultry with skin. Fill about one fourth of your plate with lean proteins, such as fish, chicken without skin, beans, eggs, or tofu. Avoid pre-made and processed foods. These tend to be higher in sodium, added sugar, and fat. Reduce your daily sodium intake. Many people with hypertension should eat less than 1,500 mg of sodium a day. Do not drink alcohol if: Your health care provider tells you not to drink. You are pregnant, may be pregnant, or are planning to become pregnant. If you drink alcohol: Limit how much you have to: 0-1 drink a day for women. 0-2 drinks a day for men. Know how much alcohol is in your drink. In the [place] one drink equals one 12 oz bottle of beer (355 mL), one 5 oz glass of wine (148 mL), or one 1 oz glass of hard liquor (44 mL). Lifestyle  Work with your health care provider to maintain a healthy body weight or to lose weight. Ask what an ideal weight is for you. Get at least 30 minutes of exercise that causes your heart to beat faster (aerobic exercise) most days of the week. Activities may include walking, swimming, or biking. Include exercise to strengthen your muscles (resistance exercise), such as Pilates or lifting weights, as part of your weekly exercise routine. Try to do these types of exercises for 30 minutes at least 3 days a week. Do not use any products that contain nicotine or tobacco. These products include cigarettes, chewing tobacco, and vaping devices, such as e-cigarettes. If you need help quitting, ask your health care provider. Monitor your blood pressure at home as told by your health care provider. Keep all follow-up visits. This is important. Medicines Take over-the-counter and prescription medicines only as told by your health care provider. Follow directions carefully. Blood  pressure medicines must be taken as prescribed. Do not skip doses of blood pressure medicine. Doing this puts you at risk for problems and can make the medicine less effective. Ask your health care provider about side effects or reactions to medicines that you should watch for. Contact a health care provider if you: Think you are having a reaction to a medicine you are taking. Have headaches that keep coming back (recurring). Feel dizzy. Have swelling in your ankles. Have trouble with your vision. Get help right away if you: Develop a severe headache or confusion. Have unusual weakness or numbness. Feel faint. Have severe pain in your chest or abdomen. Vomit repeatedly. Have trouble breathing. These symptoms may be an emergency. Get help right away. Call 911. Do not wait to see if the symptoms will go away. Do not drive yourself to the hospital. Summary Hypertension is when the force of blood pumping through your arteries is too strong. If this condition is not controlled, it may put you at risk for serious complications. Your personal target blood pressure may vary depending on your medical conditions, your age, and other factors. For most people, a normal blood pressure is less than 120/80. Hypertension is treated with lifestyle changes, medicines, or a combination of both. Lifestyle changes include losing weight, eating a healthy,   low-sodium diet, exercising more, and limiting alcohol. This information is not intended to replace advice given to you by your health care provider. Make sure you discuss any questions you have with your health care provider. Document Revised: 12/03/2020 Document Reviewed: 12/03/2020 Elsevier Patient Education  2023 Elsevier Inc.  

## 2022-06-08 NOTE — Assessment & Plan Note (Signed)
Diet and nutrition discussed Hemoglobin A1c done today. Advised to decrease amount of daily carbohydrate intake and daily calories and increase amount of plant-based protein in her diet. 

## 2022-06-08 NOTE — Assessment & Plan Note (Signed)
Well-controlled hypertension Normal blood pressure readings at home Continue amlodipine 5 mg daily and hydrochlorothiazide 12.5 mg daily Cardiovascular risk associated with hypertension discussed Dietary approaches to stop hypertension discussed Benefits of exercise discussed Follow-up in 6 months Blood work done today

## 2022-07-14 ENCOUNTER — Other Ambulatory Visit: Payer: Self-pay | Admitting: Emergency Medicine

## 2022-07-14 DIAGNOSIS — I10 Essential (primary) hypertension: Secondary | ICD-10-CM

## 2022-09-14 ENCOUNTER — Other Ambulatory Visit: Payer: Self-pay | Admitting: Obstetrics and Gynecology

## 2022-09-14 ENCOUNTER — Other Ambulatory Visit: Payer: Self-pay | Admitting: Emergency Medicine

## 2022-09-14 DIAGNOSIS — Z3041 Encounter for surveillance of contraceptive pills: Secondary | ICD-10-CM

## 2022-09-14 DIAGNOSIS — Z01419 Encounter for gynecological examination (general) (routine) without abnormal findings: Secondary | ICD-10-CM

## 2022-09-14 DIAGNOSIS — I10 Essential (primary) hypertension: Secondary | ICD-10-CM

## 2022-12-08 ENCOUNTER — Ambulatory Visit: Payer: BC Managed Care – PPO | Admitting: Emergency Medicine

## 2022-12-09 ENCOUNTER — Ambulatory Visit: Payer: BC Managed Care – PPO | Admitting: Emergency Medicine

## 2022-12-10 ENCOUNTER — Other Ambulatory Visit: Payer: Self-pay | Admitting: Obstetrics and Gynecology

## 2022-12-10 DIAGNOSIS — Z3041 Encounter for surveillance of contraceptive pills: Secondary | ICD-10-CM

## 2022-12-10 DIAGNOSIS — Z01419 Encounter for gynecological examination (general) (routine) without abnormal findings: Secondary | ICD-10-CM

## 2022-12-15 ENCOUNTER — Ambulatory Visit: Payer: BC Managed Care – PPO | Admitting: Emergency Medicine

## 2022-12-15 VITALS — BP 138/84 | HR 64 | Temp 98.4°F | Ht 66.0 in | Wt 223.5 lb

## 2022-12-15 DIAGNOSIS — I10 Essential (primary) hypertension: Secondary | ICD-10-CM

## 2022-12-15 DIAGNOSIS — R7303 Prediabetes: Secondary | ICD-10-CM | POA: Diagnosis not present

## 2022-12-15 DIAGNOSIS — Z6836 Body mass index (BMI) 36.0-36.9, adult: Secondary | ICD-10-CM | POA: Diagnosis not present

## 2022-12-15 DIAGNOSIS — Z1231 Encounter for screening mammogram for malignant neoplasm of breast: Secondary | ICD-10-CM

## 2022-12-15 NOTE — Assessment & Plan Note (Signed)
BP Readings from Last 3 Encounters:  12/15/22 138/84  06/08/22 136/84  12/02/21 138/88  Well-controlled hypertension Cardiovascular risks associated with hypertension discussed Dietary approaches to stop hypertension discussed Continue amlodipine 5 mg daily and hydrochlorothiazide 12.5 mg daily Benefits of exercise discussed Follow-up in 6 months

## 2022-12-15 NOTE — Patient Instructions (Signed)
Hypertension, Adult High blood pressure (hypertension) is when the force of blood pumping through the arteries is too strong. The arteries are the blood vessels that carry blood from the heart throughout the body. Hypertension forces the heart to work harder to pump blood and may cause arteries to become narrow or stiff. Untreated or uncontrolled hypertension can lead to a heart attack, heart failure, a stroke, kidney disease, and other problems. A blood pressure reading consists of a higher number over a lower number. Ideally, your blood pressure should be below 120/80. The first ("top") number is called the systolic pressure. It is a measure of the pressure in your arteries as your heart beats. The second ("bottom") number is called the diastolic pressure. It is a measure of the pressure in your arteries as the heart relaxes. What are the causes? The exact cause of this condition is not known. There are some conditions that result in high blood pressure. What increases the risk? Certain factors may make you more likely to develop high blood pressure. Some of these risk factors are under your control, including: Smoking. Not getting enough exercise or physical activity. Being overweight. Having too much fat, sugar, calories, or salt (sodium) in your diet. Drinking too much alcohol. Other risk factors include: Having a personal history of heart disease, diabetes, high cholesterol, or kidney disease. Stress. Having a family history of high blood pressure and high cholesterol. Having obstructive sleep apnea. Age. The risk increases with age. What are the signs or symptoms? High blood pressure may not cause symptoms. Very high blood pressure (hypertensive crisis) may cause: Headache. Fast or irregular heartbeats (palpitations). Shortness of breath. Nosebleed. Nausea and vomiting. Vision changes. Severe chest pain, dizziness, and seizures. How is this diagnosed? This condition is diagnosed by  measuring your blood pressure while you are seated, with your arm resting on a flat surface, your legs uncrossed, and your feet flat on the floor. The cuff of the blood pressure monitor will be placed directly against the skin of your upper arm at the level of your heart. Blood pressure should be measured at least twice using the same arm. Certain conditions can cause a difference in blood pressure between your right and left arms. If you have a high blood pressure reading during one visit or you have normal blood pressure with other risk factors, you may be asked to: Return on a different day to have your blood pressure checked again. Monitor your blood pressure at home for 1 week or longer. If you are diagnosed with hypertension, you may have other blood or imaging tests to help your health care provider understand your overall risk for other conditions. How is this treated? This condition is treated by making healthy lifestyle changes, such as eating healthy foods, exercising more, and reducing your alcohol intake. You may be referred for counseling on a healthy diet and physical activity. Your health care provider may prescribe medicine if lifestyle changes are not enough to get your blood pressure under control and if: Your systolic blood pressure is above 130. Your diastolic blood pressure is above 80. Your personal target blood pressure may vary depending on your medical conditions, your age, and other factors. Follow these instructions at home: Eating and drinking  Eat a diet that is high in fiber and potassium, and low in sodium, added sugar, and fat. An example of this eating plan is called the DASH diet. DASH stands for Dietary Approaches to Stop Hypertension. To eat this way: Eat   plenty of fresh fruits and vegetables. Try to fill one half of your plate at each meal with fruits and vegetables. Eat whole grains, such as whole-wheat pasta, brown rice, or whole-grain bread. Fill about one  fourth of your plate with whole grains. Eat or drink low-fat dairy products, such as skim milk or low-fat yogurt. Avoid fatty cuts of meat, processed or cured meats, and poultry with skin. Fill about one fourth of your plate with lean proteins, such as fish, chicken without skin, beans, eggs, or tofu. Avoid pre-made and processed foods. These tend to be higher in sodium, added sugar, and fat. Reduce your daily sodium intake. Many people with hypertension should eat less than 1,500 mg of sodium a day. Do not drink alcohol if: Your health care provider tells you not to drink. You are pregnant, may be pregnant, or are planning to become pregnant. If you drink alcohol: Limit how much you have to: 0-1 drink a day for women. 0-2 drinks a day for men. Know how much alcohol is in your drink. In the [place] one drink equals one 12 oz bottle of beer (355 mL), one 5 oz glass of wine (148 mL), or one 1 oz glass of hard liquor (44 mL). Lifestyle  Work with your health care provider to maintain a healthy body weight or to lose weight. Ask what an ideal weight is for you. Get at least 30 minutes of exercise that causes your heart to beat faster (aerobic exercise) most days of the week. Activities may include walking, swimming, or biking. Include exercise to strengthen your muscles (resistance exercise), such as Pilates or lifting weights, as part of your weekly exercise routine. Try to do these types of exercises for 30 minutes at least 3 days a week. Do not use any products that contain nicotine or tobacco. These products include cigarettes, chewing tobacco, and vaping devices, such as e-cigarettes. If you need help quitting, ask your health care provider. Monitor your blood pressure at home as told by your health care provider. Keep all follow-up visits. This is important. Medicines Take over-the-counter and prescription medicines only as told by your health care provider. Follow directions carefully. Blood  pressure medicines must be taken as prescribed. Do not skip doses of blood pressure medicine. Doing this puts you at risk for problems and can make the medicine less effective. Ask your health care provider about side effects or reactions to medicines that you should watch for. Contact a health care provider if you: Think you are having a reaction to a medicine you are taking. Have headaches that keep coming back (recurring). Feel dizzy. Have swelling in your ankles. Have trouble with your vision. Get help right away if you: Develop a severe headache or confusion. Have unusual weakness or numbness. Feel faint. Have severe pain in your chest or abdomen. Vomit repeatedly. Have trouble breathing. These symptoms may be an emergency. Get help right away. Call 911. Do not wait to see if the symptoms will go away. Do not drive yourself to the hospital. Summary Hypertension is when the force of blood pumping through your arteries is too strong. If this condition is not controlled, it may put you at risk for serious complications. Your personal target blood pressure may vary depending on your medical conditions, your age, and other factors. For most people, a normal blood pressure is less than 120/80. Hypertension is treated with lifestyle changes, medicines, or a combination of both. Lifestyle changes include losing weight, eating a healthy,   low-sodium diet, exercising more, and limiting alcohol. This information is not intended to replace advice given to you by your health care provider. Make sure you discuss any questions you have with your health care provider. Document Revised: 12/03/2020 Document Reviewed: 12/03/2020 Elsevier Patient Education  2024 Elsevier Inc.  

## 2022-12-15 NOTE — Assessment & Plan Note (Signed)
Diet and nutrition discussed.  Advised to decrease amount of daily carbohydrate intake and daily calories and increase amount of plant-based protein in her diet.

## 2022-12-15 NOTE — Assessment & Plan Note (Signed)
Diet and nutrition discussed. Cardiovascular risks associated with diabetes discussed

## 2022-12-15 NOTE — Progress Notes (Signed)
Carla Morrow 42 y.o.   Chief Complaint  Patient presents with   Medical Management of Chronic Issues    f/u appt, no concerns     HISTORY OF PRESENT ILLNESS: This is a 42 y.o. female here for 28-month follow-up of hypertension and prediabetes. Overall doing well.  Has no complaints or medical concerns today.  BP Readings from Last 3 Encounters:  12/15/22 138/84  06/08/22 136/84  12/02/21 138/88   Wt Readings from Last 3 Encounters:  12/15/22 223 lb 8 oz (101.4 kg)  06/08/22 228 lb 4 oz (103.5 kg)  12/02/21 224 lb (101.6 kg)     HPI   Prior to Admission medications   Medication Sig Start Date End Date Taking? Authorizing Provider  Adapalene 0.3 % gel 1 application in the evening   Yes [provider]  amLODipine (NORVASC) 5 MG tablet TAKE 1 TABLET(5 MG) BY MOUTH DAILY 07/14/22  Yes Yaritzel Stange, Eilleen Kempf, MD  hydrochlorothiazide (HYDRODIURIL) 12.5 MG tablet Take 1 tablet (12.5 mg total) by mouth daily. Follow-up appt due in Oct must see provider for future refills 09/14/22  Yes Fontella Shan, Eilleen Kempf, MD  norethindrone-ethinyl estradiol 1/35 (NORTREL 1/35, 28,) tablet TAKE 1 TABLET BY MOUTH DAILY 09/14/22  Yes Constant, Peggy, MD  prenatal vitamin w/FE, FA (PRENATAL 1 + 1) 27-1 MG TABS tablet Take 1 tablet by mouth daily before breakfast. 06/17/20  Yes Brock Bad, MD  cyclobenzaprine (FLEXERIL) 10 MG tablet Take 1 tablet (10 mg total) by mouth at bedtime. Patient not taking: Reported on 06/08/2022 12/02/21   Georgina Quint, MD    No Known Allergies  Patient Active Problem List   Diagnosis Date Noted   Prediabetes 05/13/2021   Body mass index (BMI) of 34.0-34.9 in adult 11/07/2020   Essential hypertension 11/07/2020    Past Medical History:  Diagnosis Date   Hypertension    Medical history non-contributory    No pertinent past medical history     Past Surgical History:  Procedure Laterality Date   NO PAST SURGERIES      Social History    Socioeconomic History   Marital status: Significant Other    Spouse name: Not on file   Number of children: Not on file   Years of education: Not on file   Highest education level: Bachelor's degree (e.g., BA, AB, BS)  Occupational History   Not on file  Tobacco Use   Smoking status: Never   Smokeless tobacco: Never  Vaping Use   Vaping status: Never Used  Substance and Sexual Activity   Alcohol use: No    Alcohol/week: 0.0 standard drinks of alcohol   Drug use: No   Sexual activity: Yes    Partners: Male    Birth control/protection: Pill  Other Topics Concern   Not on file  Social History Narrative   ** Merged History Encounter **       ** Merged History Encounter **       Social Determinants of Health   Financial Resource Strain: Low Risk  (12/15/2022)   Overall Financial Resource Strain (CARDIA)    Difficulty of Paying Living Expenses: Not hard at all  Food Insecurity: No Food Insecurity (12/15/2022)   Hunger Vital Sign    Worried About Running Out of Food in the Last Year: Never true    Ran Out of Food in the Last Year: Never true  Transportation Needs: No Transportation Needs (12/15/2022)   PRAPARE - Transportation    Lack  of Transportation (Medical): No    Lack of Transportation (Non-Medical): No  Physical Activity: Insufficiently Active (12/15/2022)   Exercise Vital Sign    Days of Exercise per Week: 4 days    Minutes of Exercise per Session: 30 min  Stress: No Stress Concern Present (12/15/2022)   [employer] of Occupational Health - Occupational Stress Questionnaire    Feeling of Stress : Not at all  Social Connections: Socially Integrated (12/15/2022)   Social Connection and Isolation Panel [NHANES]    Frequency of Communication with Friends and Family: More than three times a week    Frequency of Social Gatherings with Friends and Family: Once a week    Attends Religious Services: More than 4 times per year    Active Member of [employer] or  Organizations: Yes    Attends Engineer, structural: More than 4 times per year    Marital Status: Living with partner  Intimate Partner Violence: Not on file    Family History  Problem Relation Age of Onset   Anuerysm Mother    Hypertension Father      Review of Systems  Constitutional: Negative.  Negative for chills and fever.  HENT: Negative.  Negative for congestion and sore throat.   Respiratory: Negative.  Negative for cough and shortness of breath.   Cardiovascular: Negative.  Negative for chest pain and palpitations.  Gastrointestinal:  Negative for abdominal pain, diarrhea, nausea and vomiting.  Genitourinary: Negative.  Negative for dysuria and hematuria.  Skin: Negative.  Negative for rash.  Neurological: Negative.  Negative for dizziness and headaches.  All other systems reviewed and are negative.   Vitals:   12/15/22 1104  BP: 138/84  Pulse: 64  Temp: 98.4 F (36.9 C)  SpO2: 97%    Physical Exam Vitals reviewed.  Constitutional:      Appearance: Normal appearance.  HENT:     Head: Normocephalic.  Eyes:     Extraocular Movements: Extraocular movements intact.  Cardiovascular:     Rate and Rhythm: Normal rate and regular rhythm.     Pulses: Normal pulses.     Heart sounds: Normal heart sounds.  Pulmonary:     Effort: Pulmonary effort is normal.     Breath sounds: Normal breath sounds.  Musculoskeletal:     Cervical back: No tenderness.  Lymphadenopathy:     Cervical: No cervical adenopathy.  Skin:    General: Skin is warm and dry.     Capillary Refill: Capillary refill takes less than 2 seconds.  Neurological:     General: No focal deficit present.     Mental Status: She is alert and oriented to person, place, and time.  Psychiatric:        Mood and Affect: Mood normal.        Behavior: Behavior normal.      ASSESSMENT & PLAN: A total of 42 minutes was spent with the patient and counseling/coordination of care regarding preparing  for this visit, review of most recent office visit notes, diagnosis of hypertension and cardiovascular risks associated with this condition, review of all medications, review of most recent blood work results, review of health maintenance items, education on nutrition, prognosis, documentation and need for follow-up.  Problem List Items Addressed This Visit       Cardiovascular and Mediastinum   Essential hypertension - Primary    BP Readings from Last 3 Encounters:  12/15/22 138/84  06/08/22 136/84  12/02/21 138/88  Well-controlled hypertension Cardiovascular risks associated  with hypertension discussed Dietary approaches to stop hypertension discussed Continue amlodipine 5 mg daily and hydrochlorothiazide 12.5 mg daily Benefits of exercise discussed Follow-up in 6 months         Other   Body mass index (BMI) 36.0-36.9, adult    Diet and nutrition discussed Advised to decrease amount of daily carbohydrate intake and daily calories and increase amount of plant-based protein in her diet      Prediabetes    Diet and nutrition discussed Cardiovascular risks associated with diabetes discussed       Other Visit Diagnoses     Screening mammogram for breast cancer       Relevant Orders   MM 3D SCREENING MAMMOGRAM BILATERAL BREAST      Patient Instructions  Hypertension, Adult High blood pressure (hypertension) is when the force of blood pumping through the arteries is too strong. The arteries are the blood vessels that carry blood from the heart throughout the body. Hypertension forces the heart to work harder to pump blood and may cause arteries to become narrow or stiff. Untreated or uncontrolled hypertension can lead to a heart attack, heart failure, a stroke, kidney disease, and other problems. A blood pressure reading consists of a higher number over a lower number. Ideally, your blood pressure should be below 120/80. The first ("top") number is called the systolic  pressure. It is a measure of the pressure in your arteries as your heart beats. The second ("bottom") number is called the diastolic pressure. It is a measure of the pressure in your arteries as the heart relaxes. What are the causes? The exact cause of this condition is not known. There are some conditions that result in high blood pressure. What increases the risk? Certain factors may make you more likely to develop high blood pressure. Some of these risk factors are under your control, including: Smoking. Not getting enough exercise or physical activity. Being overweight. Having too much fat, sugar, calories, or salt (sodium) in your diet. Drinking too much alcohol. Other risk factors include: Having a personal history of heart disease, diabetes, high cholesterol, or kidney disease. Stress. Having a family history of high blood pressure and high cholesterol. Having obstructive sleep apnea. Age. The risk increases with age. What are the signs or symptoms? High blood pressure may not cause symptoms. Very high blood pressure (hypertensive crisis) may cause: Headache. Fast or irregular heartbeats (palpitations). Shortness of breath. Nosebleed. Nausea and vomiting. Vision changes. Severe chest pain, dizziness, and seizures. How is this diagnosed? This condition is diagnosed by measuring your blood pressure while you are seated, with your arm resting on a flat surface, your legs uncrossed, and your feet flat on the floor. The cuff of the blood pressure monitor will be placed directly against the skin of your upper arm at the level of your heart. Blood pressure should be measured at least twice using the same arm. Certain conditions can cause a difference in blood pressure between your right and left arms. If you have a high blood pressure reading during one visit or you have normal blood pressure with other risk factors, you may be asked to: Return on a different day to have your blood  pressure checked again. Monitor your blood pressure at home for 1 week or longer. If you are diagnosed with hypertension, you may have other blood or imaging tests to help your health care provider understand your overall risk for other conditions. How is this treated? This condition is treated by  making healthy lifestyle changes, such as eating healthy foods, exercising more, and reducing your alcohol intake. You may be referred for counseling on a healthy diet and physical activity. Your health care provider may prescribe medicine if lifestyle changes are not enough to get your blood pressure under control and if: Your systolic blood pressure is above 130. Your diastolic blood pressure is above 80. Your personal target blood pressure may vary depending on your medical conditions, your age, and other factors. Follow these instructions at home: Eating and drinking  Eat a diet that is high in fiber and potassium, and low in sodium, added sugar, and fat. An example of this eating plan is called the DASH diet. DASH stands for Dietary Approaches to Stop Hypertension. To eat this way: Eat plenty of fresh fruits and vegetables. Try to fill one half of your plate at each meal with fruits and vegetables. Eat whole grains, such as whole-wheat pasta, brown rice, or whole-grain bread. Fill about one fourth of your plate with whole grains. Eat or drink low-fat dairy products, such as skim milk or low-fat yogurt. Avoid fatty cuts of meat, processed or cured meats, and poultry with skin. Fill about one fourth of your plate with lean proteins, such as fish, chicken without skin, beans, eggs, or tofu. Avoid pre-made and processed foods. These tend to be higher in sodium, added sugar, and fat. Reduce your daily sodium intake. Many people with hypertension should eat less than 1,500 mg of sodium a day. Do not drink alcohol if: Your health care provider tells you not to drink. You are pregnant, may be pregnant, or  are planning to become pregnant. If you drink alcohol: Limit how much you have to: 0-1 drink a day for women. 0-2 drinks a day for men. Know how much alcohol is in your drink. In the [place] one drink equals one 12 oz bottle of beer (355 mL), one 5 oz glass of wine (148 mL), or one 1 oz glass of hard liquor (44 mL). Lifestyle  Work with your health care provider to maintain a healthy body weight or to lose weight. Ask what an ideal weight is for you. Get at least 30 minutes of exercise that causes your heart to beat faster (aerobic exercise) most days of the week. Activities may include walking, swimming, or biking. Include exercise to strengthen your muscles (resistance exercise), such as Pilates or lifting weights, as part of your weekly exercise routine. Try to do these types of exercises for 30 minutes at least 3 days a week. Do not use any products that contain nicotine or tobacco. These products include cigarettes, chewing tobacco, and vaping devices, such as e-cigarettes. If you need help quitting, ask your health care provider. Monitor your blood pressure at home as told by your health care provider. Keep all follow-up visits. This is important. Medicines Take over-the-counter and prescription medicines only as told by your health care provider. Follow directions carefully. Blood pressure medicines must be taken as prescribed. Do not skip doses of blood pressure medicine. Doing this puts you at risk for problems and can make the medicine less effective. Ask your health care provider about side effects or reactions to medicines that you should watch for. Contact a health care provider if you: Think you are having a reaction to a medicine you are taking. Have headaches that keep coming back (recurring). Feel dizzy. Have swelling in your ankles. Have trouble with your vision. Get help right away if you: Develop a  severe headache or confusion. Have unusual weakness or numbness. Feel  faint. Have severe pain in your chest or abdomen. Vomit repeatedly. Have trouble breathing. These symptoms may be an emergency. Get help right away. Call 911. Do not wait to see if the symptoms will go away. Do not drive yourself to the hospital. Summary Hypertension is when the force of blood pumping through your arteries is too strong. If this condition is not controlled, it may put you at risk for serious complications. Your personal target blood pressure may vary depending on your medical conditions, your age, and other factors. For most people, a normal blood pressure is less than 120/80. Hypertension is treated with lifestyle changes, medicines, or a combination of both. Lifestyle changes include losing weight, eating a healthy, low-sodium diet, exercising more, and limiting alcohol. This information is not intended to replace advice given to you by your health care provider. Make sure you discuss any questions you have with your health care provider. Document Revised: 12/03/2020 Document Reviewed: 12/03/2020 Elsevier Patient Education  2024 Elsevier Inc.    Edwina Barth, MD [facility]

## 2022-12-16 ENCOUNTER — Other Ambulatory Visit: Payer: Self-pay | Admitting: Emergency Medicine

## 2022-12-16 DIAGNOSIS — I10 Essential (primary) hypertension: Secondary | ICD-10-CM

## 2022-12-25 ENCOUNTER — Other Ambulatory Visit: Payer: Self-pay

## 2022-12-25 DIAGNOSIS — Z3041 Encounter for surveillance of contraceptive pills: Secondary | ICD-10-CM

## 2022-12-25 DIAGNOSIS — Z01419 Encounter for gynecological examination (general) (routine) without abnormal findings: Secondary | ICD-10-CM

## 2022-12-25 MED ORDER — NORTREL 1/35 (28) 1-35 MG-MCG PO TABS
1.0000 | ORAL_TABLET | Freq: Every day | ORAL | 0 refills | Status: DC
Start: 2022-12-25 — End: 2023-03-19

## 2023-02-04 ENCOUNTER — Ambulatory Visit: Payer: BC Managed Care – PPO

## 2023-02-12 ENCOUNTER — Ambulatory Visit (INDEPENDENT_AMBULATORY_CARE_PROVIDER_SITE_OTHER): Payer: 59

## 2023-02-12 ENCOUNTER — Other Ambulatory Visit (HOSPITAL_COMMUNITY)
Admission: RE | Admit: 2023-02-12 | Discharge: 2023-02-12 | Disposition: A | Discharge status: Home / Self Care | Payer: 59 | Source: Ambulatory Visit | Attending: Obstetrics and Gynecology | Admitting: Obstetrics and Gynecology

## 2023-02-12 VITALS — BP 206/124 | HR 76 | Wt 234.0 lb

## 2023-02-12 DIAGNOSIS — Z3200 Encounter for pregnancy test, result unknown: Secondary | ICD-10-CM

## 2023-02-12 DIAGNOSIS — N898 Other specified noninflammatory disorders of vagina: Secondary | ICD-10-CM | POA: Insufficient documentation

## 2023-02-12 DIAGNOSIS — Z3202 Encounter for pregnancy test, result negative: Secondary | ICD-10-CM | POA: Diagnosis not present

## 2023-02-12 DIAGNOSIS — R399 Unspecified symptoms and signs involving the genitourinary system: Secondary | ICD-10-CM

## 2023-02-12 LAB — POCT URINALYSIS DIPSTICK
Bilirubin, UA: NEGATIVE
Glucose, UA: NEGATIVE
Ketones, UA: NEGATIVE
Leukocytes, UA: NEGATIVE
Nitrite, UA: NEGATIVE
Protein, UA: NEGATIVE
Spec Grav, UA: 1.01 (ref 1.010–1.025)
Urobilinogen, UA: 0.2 U/dL
pH, UA: 6.5 (ref 5.0–8.0)

## 2023-02-12 LAB — POCT URINE PREGNANCY: Preg Test, Ur: NEGATIVE

## 2023-02-12 NOTE — Progress Notes (Signed)
..  SUBJECTIVE: Carla Morrow is a 43 y.o. female who complains of constant pelvic pressure and cramping for the last couple of months, without fever, chills, or abnormal vaginal discharge or bleeding. Pt would like to rule out UTI, pregnancy, and vaginal infections. Pt states her BP is managed by PCP and she is currently taking meds as prescribed and reports no abnormal symptoms related to elevated BP. Pt states that her BP is only elevated when she comes to our office, last PCP visit on 12/15/22 displayed 138/84. LMP 02/01/23  OBJECTIVE: Appears well, in no apparent distress.  BP is elevated today 177/109, pulse 74 and 206/124, pulse 76 . Urine dipstick shows positive for RBC's.    ASSESSMENT: pelvic pressure/cramping  PLAN: Treatment per orders.  Call or return to clinic prn if these symptoms worsen or fail to improve as anticipated. Consulted in office MD about BP and advised pt to contact PCP today and advise them of the BPs, pt agreed.

## 2023-02-15 ENCOUNTER — Encounter: Payer: Self-pay | Admitting: Obstetrics and Gynecology

## 2023-02-15 LAB — CERVICOVAGINAL ANCILLARY ONLY
Bacterial Vaginitis (gardnerella): POSITIVE — AB
Candida Glabrata: NEGATIVE
Candida Vaginitis: NEGATIVE
Chlamydia: NEGATIVE
Comment: NEGATIVE
Comment: NEGATIVE
Comment: NEGATIVE
Comment: NEGATIVE
Comment: NEGATIVE
Comment: NORMAL
Neisseria Gonorrhea: NEGATIVE
Trichomonas: NEGATIVE

## 2023-02-15 MED ORDER — METRONIDAZOLE 0.75 % VA GEL: 1.0000 | Freq: Every day | VAGINAL | 1 refills | Status: DC

## 2023-02-15 NOTE — Addendum Note (Signed)
 Addended by: Harvie Bridge on: 02/15/2023 06:05 PM   Modules accepted: Orders

## 2023-02-16 ENCOUNTER — Ambulatory Visit (INDEPENDENT_AMBULATORY_CARE_PROVIDER_SITE_OTHER): Payer: 59 | Admitting: Internal Medicine

## 2023-02-16 ENCOUNTER — Other Ambulatory Visit: Payer: Self-pay | Admitting: Emergency Medicine

## 2023-02-16 VITALS — BP 128/82 | HR 74 | Temp 98.2°F | Ht 66.0 in | Wt 233.0 lb

## 2023-02-16 DIAGNOSIS — I10 Essential (primary) hypertension: Secondary | ICD-10-CM

## 2023-02-16 DIAGNOSIS — R7303 Prediabetes: Secondary | ICD-10-CM

## 2023-02-16 DIAGNOSIS — R1030 Lower abdominal pain, unspecified: Secondary | ICD-10-CM | POA: Diagnosis not present

## 2023-02-16 DIAGNOSIS — M329 Systemic lupus erythematosus, unspecified: Secondary | ICD-10-CM | POA: Insufficient documentation

## 2023-02-16 DIAGNOSIS — Z202 Contact with and (suspected) exposure to infections with a predominantly sexual mode of transmission: Secondary | ICD-10-CM | POA: Diagnosis not present

## 2023-02-16 LAB — URINE CULTURE

## 2023-02-16 MED ORDER — METRONIDAZOLE 500 MG PO TABS: 500.0000 mg | ORAL_TABLET | Freq: Two times a day (BID) | ORAL | 0 refills | Status: DC

## 2023-02-16 NOTE — Assessment & Plan Note (Signed)
 Lab Results  Component Value Date   HGBA1C 6.1 06/08/2022   Stable, pt to continue current medical treatment  - diet, wt control

## 2023-02-16 NOTE — Assessment & Plan Note (Signed)
 For STD testing as above

## 2023-02-16 NOTE — Patient Instructions (Addendum)
 Please continue all other medications as before, and refills have been done if requested.  Please have the pharmacy call with any other refills you may need.  Please keep your appointments with your specialists as you may have planned  You will be contacted regarding the referral for: CT scan  Please go to the LAB at the blood drawing area for the tests to be done (at the [facility] lab at [address] in the basement)  You will be contacted by phone if any changes need to be made immediately.  Otherwise, you will receive a letter about your results with an explanation, but please check with MyChart first.

## 2023-02-16 NOTE — Assessment & Plan Note (Signed)
 Etiology unclear, for labs including cbc and lipase, also given severity and chronicity will need CT abd  pelvis, STD testing as ordered, and f/u GYN as planned or sooner if possible

## 2023-02-16 NOTE — Progress Notes (Signed)
 Patient ID: Carla Morrow, female   DOB: [birth year], 43 y.o.   MRN: [id number]        Chief Complaint: follow up lower mid and left abd pain x 1 mo       HPI:  Carla Morrow is a 43 y.o. female here with c/o lower abd pain x 1 mo or longer now mod to severe at times, without fever. Denies urinary symptoms such as dysuria, frequency, urgency, flank pain, hematuria or n/v, fever, chills.  Denies worsening reflux,  dysphagia, n/v, bowel change or blood.  Better to be still such as sitting or lying down, but worse to move stand up or walk again.  Did have recent GYN wellness exam (no physical exam) last Friday. Urine culture neg, urine pregnancy neg, has full GYN exam for feb 7.  Pt in relationship but can't be sure about the partner fideility.  BP was recently very high, but pt restarted BP meds.   Wt Readings from Last 3 Encounters:  02/16/23 233 lb (105.7 kg)  02/12/23 234 lb (106.1 kg)  12/15/22 223 lb 8 oz (101.4 kg)   BP Readings from Last 3 Encounters:  02/16/23 128/82  02/12/23 (!) 206/124  12/15/22 138/84         Past Medical History:  Diagnosis Date   Hypertension    Medical history non-contributory    No pertinent past medical history    Past Surgical History:  Procedure Laterality Date   NO PAST SURGERIES      reports that she has never smoked. She has never used smokeless tobacco. She reports that she does not drink alcohol and does not use drugs. family history includes Anuerysm in her mother; Hypertension in her father. No Known Allergies Current Outpatient Medications on File Prior to Visit  Medication Sig Dispense Refill   amLODipine  (NORVASC ) 5 MG tablet TAKE 1 TABLET(5 MG) BY MOUTH DAILY 90 tablet 3   hydrochlorothiazide  (HYDRODIURIL ) 12.5 MG tablet TAKE 1 TABLET(12.5 MG) BY MOUTH DAILY. FOLLOW-UP WITH PROVIDER FOR MORE REFILLS. 90 tablet 1   metroNIDAZOLE  (METROGEL ) 0.75 % vaginal gel Place 1 Applicatorful vaginally at bedtime. Apply one applicatorful to vagina  at bedtime for 5 days 70 g 1   Adapalene 0.3 % gel 1 application in the evening (Patient not taking: Reported on 02/16/2023)     cyclobenzaprine  (FLEXERIL ) 10 MG tablet Take 1 tablet (10 mg total) by mouth at bedtime. (Patient not taking: Reported on 06/08/2022) 30 tablet 1   norethindrone -ethinyl estradiol  1/35 (NORTREL  1/35, 28,) tablet Take 1 tablet by mouth daily. (Patient not taking: Reported on 02/16/2023) 84 tablet 0   prenatal vitamin w/FE, FA (PRENATAL 1 + 1) 27-1 MG TABS tablet Take 1 tablet by mouth daily before breakfast. (Patient not taking: Reported on 02/16/2023) 30 tablet 11   No current facility-administered medications on file prior to visit.        ROS:  All others reviewed and negative.  Objective        PE:  BP 128/82 (BP Location: Left Arm, Patient Position: Sitting, Cuff Size: Normal)   Pulse 74   Temp 98.2 F (36.8 C) (Oral)   Ht 5' 6 (1.676 m)   Wt 233 lb (105.7 kg)   LMP 02/01/2023   SpO2 98%   BMI 37.61 kg/m                 Constitutional: Pt appears in NAD  HENT: Head: NCAT.                Right Ear: External ear normal.                 Left Ear: External ear normal.                Eyes: . Pupils are equal, round, and reactive to light. Conjunctivae and EOM are normal               Nose: without d/c or deformity               Neck: Neck supple. Gross normal ROM               Cardiovascular: Normal rate and regular rhythm.                 Pulmonary/Chest: Effort normal and breath sounds without rales or wheezing.                Abd:  Soft, low mid and LLQ tender, no rebound or guarding,, ND, + BS, no organomegaly               Neurological: Pt is alert. At baseline orientation, motor grossly intact               Skin: Skin is warm. No rashes, no other new lesions, LE edema - none               Psychiatric: Pt behavior is normal without agitation   Micro: none  Cardiac tracings I have personally interpreted today:  none  Pertinent Radiological  findings (summarize): none   Lab Results  Component Value Date   WBC 9.1 06/08/2022   HGB 13.1 06/08/2022   HCT 39.7 06/08/2022   PLT 238.0 06/08/2022   GLUCOSE 79 06/08/2022   CHOL 151 06/08/2022   TRIG 146.0 06/08/2022   HDL 59.60 06/08/2022   LDLDIRECT 85.0 11/07/2020   LDLCALC 62 06/08/2022   ALT 12 06/08/2022   AST 14 06/08/2022   NA 137 06/08/2022   K 3.4 (L) 06/08/2022   CL 101 06/08/2022   CREATININE 0.78 06/08/2022   BUN 9 06/08/2022   CO2 28 06/08/2022   TSH 1.524 02/08/2015   HGBA1C 6.1 06/08/2022   Assessment/Plan:  Carla Morrow is a 43 y.o. Black or African American [2] female with  has a past medical history of Hypertension, Medical history non-contributory, and No pertinent past medical history.  Lower abdominal pain Etiology unclear, for labs including cbc and lipase, also given severity and chronicity will need CT abd  pelvis, STD testing as ordered, and f/u GYN as planned or sooner if possible  Essential hypertension BP Readings from Last 3 Encounters:  02/16/23 128/82  02/12/23 (!) 206/124  12/15/22 138/84   Stable, pt to continue medical treatment norvasc  5 mg and hct 12.5 qd   Prediabetes Lab Results  Component Value Date   HGBA1C 6.1 06/08/2022   Stable, pt to continue current medical treatment  - diet, wt control   Possible exposure to STD For STD testing as above  Followup: Return if symptoms worsen or fail to improve.  Lynwood Rush, MD 02/16/2023 7:46 PM [facility]

## 2023-02-16 NOTE — Progress Notes (Signed)
 Rx for metro pills

## 2023-02-16 NOTE — Assessment & Plan Note (Signed)
 BP Readings from Last 3 Encounters:  02/16/23 128/82  02/12/23 (!) 206/124  12/15/22 138/84   Stable, pt to continue medical treatment norvasc 5 mg and hct 12.5 qd

## 2023-02-17 ENCOUNTER — Encounter: Payer: Self-pay | Admitting: Internal Medicine

## 2023-02-17 LAB — CBC WITH DIFFERENTIAL/PLATELET
Basophils Absolute: 0 10*3/uL (ref 0.0–0.1)
Basophils Relative: 0.6 % (ref 0.0–3.0)
Eosinophils Absolute: 0.1 10*3/uL (ref 0.0–0.7)
Eosinophils Relative: 1.2 % (ref 0.0–5.0)
HCT: 40.8 % (ref 36.0–46.0)
Hemoglobin: 13.5 g/dL (ref 12.0–15.0)
Lymphocytes Relative: 25.1 % (ref 12.0–46.0)
Lymphs Abs: 1.9 10*3/uL (ref 0.7–4.0)
MCHC: 33 g/dL (ref 30.0–36.0)
MCV: 84.4 fL (ref 78.0–100.0)
Monocytes Absolute: 0.7 10*3/uL (ref 0.1–1.0)
Monocytes Relative: 10.1 % (ref 3.0–12.0)
Neutro Abs: 4.7 10*3/uL (ref 1.4–7.7)
Neutrophils Relative %: 63 % (ref 43.0–77.0)
Platelets: 258 10*3/uL (ref 150.0–400.0)
RBC: 4.83 Mil/uL (ref 3.87–5.11)
RDW: 14.2 % (ref 11.5–15.5)
WBC: 7.4 10*3/uL (ref 4.0–10.5)

## 2023-02-17 LAB — BASIC METABOLIC PANEL
BUN: 12 mg/dL (ref 6–23)
CO2: 29 meq/L (ref 19–32)
Calcium: 9.2 mg/dL (ref 8.4–10.5)
Chloride: 99 meq/L (ref 96–112)
Creatinine, Ser: 0.65 mg/dL (ref 0.40–1.20)
GFR: 110.22 mL/min (ref >60.00)
Glucose, Bld: 96 mg/dL (ref 70–99)
Potassium: 3.3 meq/L — ABNORMAL LOW (ref 3.5–5.1)
Sodium: 136 meq/L (ref 135–145)

## 2023-02-17 LAB — URINALYSIS, ROUTINE W REFLEX MICROSCOPIC
Bilirubin Urine: NEGATIVE
Ketones, ur: NEGATIVE
Leukocytes,Ua: NEGATIVE
Nitrite: NEGATIVE
Specific Gravity, Urine: 1.015 (ref 1.000–1.030)
Total Protein, Urine: NEGATIVE
Urine Glucose: NEGATIVE
Urobilinogen, UA: 0.2 (ref 0.0–1.0)
pH: 7 (ref 5.0–8.0)

## 2023-02-17 LAB — HEPATIC FUNCTION PANEL
ALT: 20 U/L (ref 0–35)
AST: 21 U/L (ref 0–37)
Albumin: 4.2 g/dL (ref 3.5–5.2)
Alkaline Phosphatase: 75 U/L (ref 39–117)
Bilirubin, Direct: 0.1 mg/dL (ref 0.0–0.3)
Total Bilirubin: 0.2 mg/dL (ref 0.2–1.2)
Total Protein: 7.5 g/dL (ref 6.0–8.3)

## 2023-02-17 LAB — LIPASE: Lipase: 20 U/L (ref 11.0–59.0)

## 2023-02-19 ENCOUNTER — Other Ambulatory Visit: Payer: Self-pay | Admitting: Internal Medicine

## 2023-02-19 ENCOUNTER — Ambulatory Visit
Admission: RE | Admit: 2023-02-19 | Discharge: 2023-02-19 | Disposition: A | Discharge status: Home / Self Care | Payer: 59 | Source: Ambulatory Visit | Attending: Internal Medicine | Admitting: Internal Medicine

## 2023-02-19 DIAGNOSIS — R3129 Other microscopic hematuria: Secondary | ICD-10-CM

## 2023-02-19 DIAGNOSIS — R1030 Lower abdominal pain, unspecified: Secondary | ICD-10-CM

## 2023-02-19 LAB — GC/CHLAMYDIA PROBE AMP
Chlamydia trachomatis, NAA: NEGATIVE
Neisseria Gonorrhoeae by PCR: NEGATIVE

## 2023-02-21 LAB — HSV 2 ANTIBODY, IGG: HSV 2 Glycoprotein G Ab, IgG: <0.9 {index}

## 2023-02-21 LAB — RPR: RPR Ser Ql: NONREACTIVE

## 2023-02-21 LAB — HIV ANTIBODY (ROUTINE TESTING W REFLEX): HIV 1&2 Ab, 4th Generation: NONREACTIVE

## 2023-02-22 ENCOUNTER — Ambulatory Visit: Payer: BC Managed Care – PPO

## 2023-03-10 ENCOUNTER — Ambulatory Visit
Admission: RE | Admit: 2023-03-10 | Discharge: 2023-03-10 | Disposition: A | Discharge status: Home / Self Care | Payer: 59 | Source: Ambulatory Visit | Attending: Emergency Medicine | Admitting: Emergency Medicine

## 2023-03-10 DIAGNOSIS — Z1231 Encounter for screening mammogram for malignant neoplasm of breast: Secondary | ICD-10-CM

## 2023-03-15 ENCOUNTER — Other Ambulatory Visit: Payer: Self-pay | Admitting: Emergency Medicine

## 2023-03-15 DIAGNOSIS — R928 Other abnormal and inconclusive findings on diagnostic imaging of breast: Secondary | ICD-10-CM

## 2023-03-19 ENCOUNTER — Other Ambulatory Visit (HOSPITAL_COMMUNITY)
Admission: RE | Admit: 2023-03-19 | Discharge: 2023-03-19 | Disposition: A | Discharge status: Home / Self Care | Payer: 59 | Source: Ambulatory Visit | Attending: Obstetrics | Admitting: Obstetrics

## 2023-03-19 ENCOUNTER — Encounter: Payer: Self-pay | Admitting: Obstetrics

## 2023-03-19 ENCOUNTER — Ambulatory Visit: Payer: 59 | Admitting: Obstetrics

## 2023-03-19 VITALS — BP 152/98 | HR 70 | Wt 231.2 lb

## 2023-03-19 DIAGNOSIS — Z01419 Encounter for gynecological examination (general) (routine) without abnormal findings: Secondary | ICD-10-CM | POA: Diagnosis present

## 2023-03-19 DIAGNOSIS — N898 Other specified noninflammatory disorders of vagina: Secondary | ICD-10-CM

## 2023-03-19 DIAGNOSIS — E669 Obesity, unspecified: Secondary | ICD-10-CM

## 2023-03-19 DIAGNOSIS — I10 Essential (primary) hypertension: Secondary | ICD-10-CM

## 2023-03-19 DIAGNOSIS — Z3041 Encounter for surveillance of contraceptive pills: Secondary | ICD-10-CM

## 2023-03-19 MED ORDER — NORTREL 1/35 (28) 1-35 MG-MCG PO TABS: 1.0000 | ORAL_TABLET | Freq: Every day | ORAL | 11 refills | Status: DC

## 2023-03-19 NOTE — Progress Notes (Signed)
 Pt presents for AEX. Pap today. STD testing today. Needs BC refills.

## 2023-03-19 NOTE — Progress Notes (Signed)
 Subjective:        Carla Morrow is a 43 y.o. female here for a routine exam.  Current complaints: Vaginal discharge.    Personal health questionnaire:  Is patient Ashkenazi Jewish, have a family history of breast and/or ovarian cancer: no Is there a family history of uterine cancer diagnosed at age < 3, gastrointestinal cancer, urinary tract cancer, family member who is a Personnel Officer syndrome-associated carrier: no Is the patient overweight and hypertensive, family history of diabetes, personal history of gestational diabetes, preeclampsia or PCOS: no Is patient over 81, have PCOS,  family history of premature CHD under age 87, diabetes, smoke, have hypertension or peripheral artery disease:  no At any time, has a partner hit, kicked or otherwise hurt or frightened you?: no Over the past 2 weeks, have you felt down, depressed or hopeless?: no Over the past 2 weeks, have you felt little interest or pleasure in doing things?:no   Gynecologic History Patient's last menstrual period was 03/03/2023. Contraception: OCP (estrogen/progesterone) Last Pap: 2022. Results were: normal Last mammogram: 2025. Results were: normal  Obstetric History OB History  Gravida Para Term Preterm AB Living  4 4 3 1  0 4  SAB IAB Ectopic Multiple Live Births  0 0 0  4    # Outcome Date GA Lbr Len/2nd Weight Sex Type Anes PTL Lv  4 Term 07/07/15 [redacted]w[redacted]d 08:45 6 lb 11.1 oz (3.035 kg) M Vag-Spont None  LIV  3 Preterm 09/27/11 [redacted]w[redacted]d 08:26 / 00:11 5 lb 15.1 oz (2.696 kg) M Vag-Spont None  LIV     Birth Comments: WNL  2 Term 03/24/03 [redacted]w[redacted]d  6 lb 12 oz (3.062 kg) F Vag-Spont EPI N LIV  1 Term 04/20/97 [redacted]w[redacted]d  7 lb 8 oz (3.402 kg) F Vag-Spont EPI N LIV    Past Medical History:  Diagnosis Date   Hypertension    Medical history non-contributory    No pertinent past medical history     Past Surgical History:  Procedure Laterality Date   NO PAST SURGERIES       Current Outpatient Medications:    amLODipine   (NORVASC ) 5 MG tablet, TAKE 1 TABLET(5 MG) BY MOUTH DAILY, Disp: 90 tablet, Rfl: 3   hydrochlorothiazide  (HYDRODIURIL ) 12.5 MG tablet, TAKE 1 TABLET(12.5 MG) BY MOUTH DAILY. FOLLOW-UP WITH PROVIDER FOR MORE REFILLS., Disp: 90 tablet, Rfl: 1   Adapalene 0.3 % gel, 1 application in the evening (Patient not taking: Reported on 02/12/2023), Disp: , Rfl:    cyclobenzaprine  (FLEXERIL ) 10 MG tablet, Take 1 tablet (10 mg total) by mouth at bedtime. (Patient not taking: Reported on 03/19/2023), Disp: 30 tablet, Rfl: 1   metroNIDAZOLE  (FLAGYL ) 500 MG tablet, Take 1 tablet (500 mg total) by mouth 2 (two) times daily. (Patient not taking: Reported on 03/19/2023), Disp: 14 tablet, Rfl: 0   metroNIDAZOLE  (METROGEL ) 0.75 % vaginal gel, Place 1 Applicatorful vaginally at bedtime. Apply one applicatorful to vagina at bedtime for 5 days (Patient not taking: Reported on 03/19/2023), Disp: 70 g, Rfl: 1   norethindrone -ethinyl estradiol  1/35 (NORTREL  1/35, 28,) tablet, Take 1 tablet by mouth daily., Disp: 30 tablet, Rfl: 11   prenatal vitamin w/FE, FA (PRENATAL 1 + 1) 27-1 MG TABS tablet, Take 1 tablet by mouth daily before breakfast. (Patient not taking: Reported on 02/12/2023), Disp: 30 tablet, Rfl: 11 No Known Allergies  Social History   Tobacco Use   Smoking status: Never   Smokeless tobacco: Never  Substance Use Topics  Alcohol use: No    Alcohol/week: 0.0 standard drinks of alcohol    Family History  Problem Relation Age of Onset   Anuerysm Mother    Hypertension Father       Review of Systems  Constitutional: negative for fatigue and weight loss Respiratory: negative for cough and wheezing Cardiovascular: negative for chest pain, fatigue and palpitations Gastrointestinal: negative for abdominal pain and change in bowel habits Musculoskeletal:negative for myalgias Neurological: negative for gait problems and tremors Behavioral/Psych: negative for abusive relationship, depression Endocrine: negative for  temperature intolerance    Genitourinary:negative for abnormal menstrual periods, genital lesions, hot flashes, sexual problems and vaginal discharge Integument/breast: negative for breast lump, breast tenderness, nipple discharge and skin lesion(s)    Objective:       BP (!) 152/98   Pulse 70   Wt 231 lb 3.2 oz (104.9 kg)   LMP 03/03/2023   BMI 37.32 kg/m  General:   alert  Skin:   no rash or abnormalities  Lungs:   clear to auscultation bilaterally  Heart:   regular rate and rhythm, S1, S2 normal, no murmur, click, rub or gallop  Breasts:   normal without suspicious masses, skin or nipple changes or axillary nodes  Abdomen:  normal findings: no organomegaly, soft, non-tender and no hernia  Pelvis:  External genitalia: normal general appearance Urinary system: urethral meatus normal and bladder without fullness, nontender Vaginal: normal without tenderness, induration or masses Cervix: normal appearance Adnexa: normal bimanual exam Uterus: anteverted and non-tender, normal size   Lab Review Urine pregnancy test Labs reviewed yes Radiologic studies reviewed yes  I have spent a total of 20 minutes of face-to-face time, excluding clinical staff time, reviewing notes and preparing to see patient, ordering tests and/or medications, and counseling the patient.   Assessment:    1. Encounter for gynecological examination with Papanicolaou smear of cervix (Primary) Rx: - Cytology - PAP( [place]) - norethindrone -ethinyl estradiol  1/35 (NORTREL  1/35, 28,) tablet; Take 1 tablet by mouth daily.  Dispense: 30 tablet; Refill: 11  2. Vaginal discharge Rx: - Cervicovaginal ancillary only( [place])  3. Encounter for surveillance of contraceptive pills Rx: - norethindrone -ethinyl estradiol  1/35 (NORTREL  1/35, 28,) tablet; Take 1 tablet by mouth daily.  Dispense: 30 tablet; Refill: 11  4. HTN (hypertension), benign - followed by PCP  5. Obesity (BMI 35.0-39.9 without  comorbidity) - weight reduction with the aid of dietary changes, exercise and behavioral modification recommended     Plan:    Education reviewed: calcium supplements, depression evaluation, low fat, low cholesterol diet, safe sex/STD prevention, self breast exams, and weight bearing exercise. Contraception: OCP (estrogen/progesterone). Follow up in: 1 year.   Meds ordered this encounter  Medications   norethindrone -ethinyl estradiol  1/35 (NORTREL  1/35, 28,) tablet    Sig: Take 1 tablet by mouth daily.    Dispense:  30 tablet    Refill:  11    CARLIN RONAL CENTERS, MD, FACOG Attending Obstetrician & Gynecologist, [facility] for [facility], [facility], [place] 03/19/2023

## 2023-03-22 LAB — CERVICOVAGINAL ANCILLARY ONLY
Bacterial Vaginitis (gardnerella): NEGATIVE
Candida Glabrata: NEGATIVE
Candida Vaginitis: NEGATIVE
Chlamydia: NEGATIVE
Comment: NEGATIVE
Comment: NEGATIVE
Comment: NEGATIVE
Comment: NEGATIVE
Comment: NEGATIVE
Comment: NORMAL
Neisseria Gonorrhea: NEGATIVE
Trichomonas: NEGATIVE

## 2023-03-24 ENCOUNTER — Other Ambulatory Visit: Payer: 59

## 2023-03-24 ENCOUNTER — Inpatient Hospital Stay: Admission: RE | Admit: 2023-03-24 | Payer: 59 | Source: Ambulatory Visit

## 2023-03-25 LAB — CYTOLOGY - PAP
Comment: NEGATIVE
Diagnosis: UNDETERMINED — AB
High risk HPV: NEGATIVE

## 2023-04-21 ENCOUNTER — Ambulatory Visit
Admission: RE | Admit: 2023-04-21 | Discharge: 2023-04-21 | Disposition: A | Discharge status: Home / Self Care | Payer: 59 | Source: Ambulatory Visit | Attending: Emergency Medicine

## 2023-04-21 DIAGNOSIS — R928 Other abnormal and inconclusive findings on diagnostic imaging of breast: Secondary | ICD-10-CM

## 2023-06-14 ENCOUNTER — Ambulatory Visit: Payer: BC Managed Care – PPO | Admitting: Emergency Medicine

## 2023-06-23 ENCOUNTER — Encounter: Payer: Self-pay | Admitting: Emergency Medicine

## 2023-06-23 ENCOUNTER — Ambulatory Visit: Admitting: Emergency Medicine

## 2023-06-23 VITALS — BP 144/90 | HR 81 | Temp 98.1°F | Ht 66.0 in | Wt 227.1 lb

## 2023-06-23 DIAGNOSIS — R7303 Prediabetes: Secondary | ICD-10-CM | POA: Diagnosis not present

## 2023-06-23 DIAGNOSIS — I10 Essential (primary) hypertension: Secondary | ICD-10-CM | POA: Diagnosis not present

## 2023-06-23 DIAGNOSIS — M5412 Radiculopathy, cervical region: Secondary | ICD-10-CM | POA: Diagnosis not present

## 2023-06-23 NOTE — Assessment & Plan Note (Signed)
 BP Readings from Last 3 Encounters:  06/23/23 (!) 144/90  03/19/23 (!) 152/98  02/16/23 128/82  Elevated blood pressure reading in the office but normal at home. Cardiovascular risks associated with hypertension discussed Continue amlodipine  5 mg daily and hydrochlorothiazide  12.5 mg daily Dietary approaches to stop hypertension discussed. Advised to monitor blood pressure readings at home daily for the next several weeks and keep a log.  Advised to contact the office if numbers persistently abnormal.

## 2023-06-23 NOTE — Assessment & Plan Note (Signed)
 Chronic stable condition Cardiovascular risks associated with diabetes discussed Diet and nutrition discussed Advised to increase amount of daily carbohydrate intake and daily calories and increase amount of plant-based protein diet Benefits of exercise discussed

## 2023-06-23 NOTE — Patient Instructions (Signed)
 Hypertension, Adult High blood pressure (hypertension) is when the force of blood pumping through the arteries is too strong. The arteries are the blood vessels that carry blood from the heart throughout the body. Hypertension forces the heart to work harder to pump blood and may cause arteries to become narrow or stiff. Untreated or uncontrolled hypertension can lead to a heart attack, heart failure, a stroke, kidney disease, and other problems. A blood pressure reading consists of a higher number over a lower number. Ideally, your blood pressure should be below 120/80. The first ("top") number is called the systolic pressure. It is a measure of the pressure in your arteries as your heart beats. The second ("bottom") number is called the diastolic pressure. It is a measure of the pressure in your arteries as the heart relaxes. What are the causes? The exact cause of this condition is not known. There are some conditions that result in high blood pressure. What increases the risk? Certain factors may make you more likely to develop high blood pressure. Some of these risk factors are under your control, including: Smoking. Not getting enough exercise or physical activity. Being overweight. Having too much fat, sugar, calories, or salt (sodium) in your diet. Drinking too much alcohol. Other risk factors include: Having a personal history of heart disease, diabetes, high cholesterol, or kidney disease. Stress. Having a family history of high blood pressure and high cholesterol. Having obstructive sleep apnea. Age. The risk increases with age. What are the signs or symptoms? High blood pressure may not cause symptoms. Very high blood pressure (hypertensive crisis) may cause: Headache. Fast or irregular heartbeats (palpitations). Shortness of breath. Nosebleed. Nausea and vomiting. Vision changes. Severe chest pain, dizziness, and seizures. How is this diagnosed? This condition is diagnosed by  measuring your blood pressure while you are seated, with your arm resting on a flat surface, your legs uncrossed, and your feet flat on the floor. The cuff of the blood pressure monitor will be placed directly against the skin of your upper arm at the level of your heart. Blood pressure should be measured at least twice using the same arm. Certain conditions can cause a difference in blood pressure between your right and left arms. If you have a high blood pressure reading during one visit or you have normal blood pressure with other risk factors, you may be asked to: Return on a different day to have your blood pressure checked again. Monitor your blood pressure at home for 1 week or longer. If you are diagnosed with hypertension, you may have other blood or imaging tests to help your health care provider understand your overall risk for other conditions. How is this treated? This condition is treated by making healthy lifestyle changes, such as eating healthy foods, exercising more, and reducing your alcohol intake. You may be referred for counseling on a healthy diet and physical activity. Your health care provider may prescribe medicine if lifestyle changes are not enough to get your blood pressure under control and if: Your systolic blood pressure is above 130. Your diastolic blood pressure is above 80. Your personal target blood pressure may vary depending on your medical conditions, your age, and other factors. Follow these instructions at home: Eating and drinking  Eat a diet that is high in fiber and potassium, and low in sodium, added sugar, and fat. An example of this eating plan is called the DASH diet. DASH stands for Dietary Approaches to Stop Hypertension. To eat this way: Eat  plenty of fresh fruits and vegetables. Try to fill one half of your plate at each meal with fruits and vegetables. Eat whole grains, such as whole-wheat pasta, brown rice, or whole-grain bread. Fill about one  fourth of your plate with whole grains. Eat or drink low-fat dairy products, such as skim milk or low-fat yogurt. Avoid fatty cuts of meat, processed or cured meats, and poultry with skin. Fill about one fourth of your plate with lean proteins, such as fish, chicken without skin, beans, eggs, or tofu. Avoid pre-made and processed foods. These tend to be higher in sodium, added sugar, and fat. Reduce your daily sodium intake. Many people with hypertension should eat less than 1,500 mg of sodium a day. Do not drink alcohol if: Your health care provider tells you not to drink. You are pregnant, may be pregnant, or are planning to become pregnant. If you drink alcohol: Limit how much you have to: 0-1 drink a day for women. 0-2 drinks a day for men. Know how much alcohol is in your drink. In the [place] one drink equals one 12 oz bottle of beer (355 mL), one 5 oz glass of wine (148 mL), or one 1 oz glass of hard liquor (44 mL). Lifestyle  Work with your health care provider to maintain a healthy body weight or to lose weight. Ask what an ideal weight is for you. Get at least 30 minutes of exercise that causes your heart to beat faster (aerobic exercise) most days of the week. Activities may include walking, swimming, or biking. Include exercise to strengthen your muscles (resistance exercise), such as Pilates or lifting weights, as part of your weekly exercise routine. Try to do these types of exercises for 30 minutes at least 3 days a week. Do not use any products that contain nicotine or tobacco. These products include cigarettes, chewing tobacco, and vaping devices, such as e-cigarettes. If you need help quitting, ask your health care provider. Monitor your blood pressure at home as told by your health care provider. Keep all follow-up visits. This is important. Medicines Take over-the-counter and prescription medicines only as told by your health care provider. Follow directions carefully. Blood  pressure medicines must be taken as prescribed. Do not skip doses of blood pressure medicine. Doing this puts you at risk for problems and can make the medicine less effective. Ask your health care provider about side effects or reactions to medicines that you should watch for. Contact a health care provider if you: Think you are having a reaction to a medicine you are taking. Have headaches that keep coming back (recurring). Feel dizzy. Have swelling in your ankles. Have trouble with your vision. Get help right away if you: Develop a severe headache or confusion. Have unusual weakness or numbness. Feel faint. Have severe pain in your chest or abdomen. Vomit repeatedly. Have trouble breathing. These symptoms may be an emergency. Get help right away. Call 911. Do not wait to see if the symptoms will go away. Do not drive yourself to the hospital. Summary Hypertension is when the force of blood pumping through your arteries is too strong. If this condition is not controlled, it may put you at risk for serious complications. Your personal target blood pressure may vary depending on your medical conditions, your age, and other factors. For most people, a normal blood pressure is less than 120/80. Hypertension is treated with lifestyle changes, medicines, or a combination of both. Lifestyle changes include losing weight, eating a healthy,  low-sodium diet, exercising more, and limiting alcohol. This information is not intended to replace advice given to you by your health care provider. Make sure you discuss any questions you have with your health care provider. Document Revised: 12/03/2020 Document Reviewed: 12/03/2020 Elsevier Patient Education  2024 [employer].

## 2023-06-23 NOTE — Assessment & Plan Note (Signed)
 Intermittent symptoms.  Mechanical in nature. Symptom management discussed. Possible triggers discussed Benign physical examination.  No red flag signs or symptoms

## 2023-06-23 NOTE — Progress Notes (Signed)
 Carla Morrow 43 y.o.   Chief Complaint  Patient presents with   Medical Management of Chronic Issues    6 month follow, having some pain in the right side of her body, from the right head area to the arm     HISTORY OF PRESENT ILLNESS: This is a 43 y.o. female here for 73-month follow-up of hypertension and prediabetes Also complaining of pain to right side of neck radiating down right arm with occasional tingling. No other complaints or medical concerns today.   HPI   Prior to Admission medications   Medication Sig Start Date End Date Taking? Authorizing Provider  amLODipine  (NORVASC ) 5 MG tablet TAKE 1 TABLET(5 MG) BY MOUTH DAILY 07/14/22  Yes Ermine Stebbins, [place], MD  hydrochlorothiazide  (HYDRODIURIL ) 12.5 MG tablet TAKE 1 TABLET(12.5 MG) BY MOUTH DAILY. FOLLOW-UP WITH PROVIDER FOR MORE REFILLS. 12/16/22  Yes Justyn Langham Jose, MD  norethindrone -ethinyl estradiol  1/35 (NORTREL  1/35, 28,) tablet Take 1 tablet by mouth daily. 03/19/23  Yes Gabrielle Joiner, MD    No Known Allergies  Patient Active Problem List   Diagnosis Date Noted   Systemic lupus erythematosus, unspecified (HCC) 02/16/2023   Prediabetes 05/13/2021   Body mass index (BMI) 36.0-36.9, adult 11/07/2020   Essential hypertension 11/07/2020    Past Medical History:  Diagnosis Date   Hypertension    Medical history non-contributory    No pertinent past medical history     Past Surgical History:  Procedure Laterality Date   NO PAST SURGERIES      Social History   Socioeconomic History   Marital status: Significant Other    Spouse name: Not on file   Number of children: Not on file   Years of education: Not on file   Highest education level: Bachelor's degree (e.g., BA, AB, BS)  Occupational History   Not on file  Tobacco Use   Smoking status: Never   Smokeless tobacco: Never  Vaping Use   Vaping status: Never Used  Substance and Sexual Activity   Alcohol use: No    Alcohol/week: 0.0  standard drinks of alcohol   Drug use: No   Sexual activity: Yes    Partners: Male    Birth control/protection: Pill  Other Topics Concern   Not on file  Social History Narrative   ** Merged History Encounter **       ** Merged History Encounter **       Social Drivers of Corporate investment banker Strain: Low Risk  (02/16/2023)   Overall Financial Resource Strain (CARDIA)    Difficulty of Paying Living Expenses: Not hard at all  Food Insecurity: No Food Insecurity (02/16/2023)   Hunger Vital Sign    Worried About Running Out of Food in the Last Year: Never true    Ran Out of Food in the Last Year: Never true  Transportation Needs: No Transportation Needs (02/16/2023)   PRAPARE - Administrator, Civil Service (Medical): No    Lack of Transportation (Non-Medical): No  Physical Activity: Insufficiently Active (02/16/2023)   Exercise Vital Sign    Days of Exercise per Week: 3 days    Minutes of Exercise per Session: 20 min  Stress: No Stress Concern Present (02/16/2023)   [employer] of Occupational Health - Occupational Stress Questionnaire    Feeling of Stress : Only a little  Social Connections: Moderately Integrated (02/16/2023)   Social Connection and Isolation Panel [NHANES]    Frequency of Communication with Friends  and Family: More than three times a week    Frequency of Social Gatherings with Friends and Family: More than three times a week    Attends Religious Services: More than 4 times per year    Active Member of [employer] or Organizations: Yes    Attends Engineer, structural: More than 4 times per year    Marital Status: Divorced  Catering manager Violence: Not on file    Family History  Problem Relation Age of Onset   Anuerysm Mother    Hypertension Father      Review of Systems  Constitutional: Negative.  Negative for chills and fever.  HENT: Negative.  Negative for congestion and sore throat.   Respiratory: Negative.  Negative for cough  and shortness of breath.   Cardiovascular: Negative.  Negative for chest pain and palpitations.  Gastrointestinal:  Negative for abdominal pain, diarrhea, nausea and vomiting.  Musculoskeletal:  Positive for neck pain.  Skin: Negative.  Negative for rash.  Neurological:  Negative for dizziness and headaches.  All other systems reviewed and are negative.   Vitals:   06/23/23 1550  BP: (!) 144/90  Pulse: 81  Temp: 98.1 F (36.7 C)  SpO2: 94%    Physical Exam Vitals reviewed.  Constitutional:      Appearance: Normal appearance.  HENT:     Head: Normocephalic.     Mouth/Throat:     Mouth: Mucous membranes are moist.     Pharynx: Oropharynx is clear.  Eyes:     Extraocular Movements: Extraocular movements intact.     Pupils: Pupils are equal, round, and reactive to light.  Cardiovascular:     Rate and Rhythm: Normal rate and regular rhythm.     Pulses: Normal pulses.     Heart sounds: Normal heart sounds.  Pulmonary:     Effort: Pulmonary effort is normal.     Breath sounds: Normal breath sounds.  Musculoskeletal:     Cervical back: No tenderness.  Lymphadenopathy:     Cervical: No cervical adenopathy.  Skin:    General: Skin is warm and dry.     Capillary Refill: Capillary refill takes less than 2 seconds.  Neurological:     General: No focal deficit present.     Mental Status: She is alert and oriented to person, place, and time.  Psychiatric:        Mood and Affect: Mood normal.        Behavior: Behavior normal.      ASSESSMENT & PLAN: A total of 42 minutes was spent with the patient and counseling/coordination of care regarding preparing for this visit, review of most recent office visit notes, review of multiple chronic medical conditions and their management, review of all medications, review of most recent bloodwork results, review of health maintenance items, education on nutrition, prognosis, documentation, and need for follow up.  Problem List Items  Addressed This Visit       Cardiovascular and Mediastinum   Essential hypertension - Primary   BP Readings from Last 3 Encounters:  06/23/23 (!) 144/90  03/19/23 (!) 152/98  02/16/23 128/82  Elevated blood pressure reading in the office but normal at home. Cardiovascular risks associated with hypertension discussed Continue amlodipine  5 mg daily and hydrochlorothiazide  12.5 mg daily Dietary approaches to stop hypertension discussed. Advised to monitor blood pressure readings at home daily for the next several weeks and keep a log.  Advised to contact the office if numbers persistently abnormal.  Nervous and Auditory   Cervical radiculopathy   Intermittent symptoms.  Mechanical in nature. Symptom management discussed. Possible triggers discussed Benign physical examination.  No red flag signs or symptoms        Other   Prediabetes   Chronic stable condition Cardiovascular risks associated with diabetes discussed Diet and nutrition discussed Advised to increase amount of daily carbohydrate intake and daily calories and increase amount of plant-based protein diet Benefits of exercise discussed      Patient Instructions  Hypertension, Adult High blood pressure (hypertension) is when the force of blood pumping through the arteries is too strong. The arteries are the blood vessels that carry blood from the heart throughout the body. Hypertension forces the heart to work harder to pump blood and may cause arteries to become narrow or stiff. Untreated or uncontrolled hypertension can lead to a heart attack, heart failure, a stroke, kidney disease, and other problems. A blood pressure reading consists of a higher number over a lower number. Ideally, your blood pressure should be below 120/80. The first ("top") number is called the systolic pressure. It is a measure of the pressure in your arteries as your heart beats. The second ("bottom") number is called the diastolic  pressure. It is a measure of the pressure in your arteries as the heart relaxes. What are the causes? The exact cause of this condition is not known. There are some conditions that result in high blood pressure. What increases the risk? Certain factors may make you more likely to develop high blood pressure. Some of these risk factors are under your control, including: Smoking. Not getting enough exercise or physical activity. Being overweight. Having too much fat, sugar, calories, or salt (sodium) in your diet. Drinking too much alcohol. Other risk factors include: Having a personal history of heart disease, diabetes, high cholesterol, or kidney disease. Stress. Having a family history of high blood pressure and high cholesterol. Having obstructive sleep apnea. Age. The risk increases with age. What are the signs or symptoms? High blood pressure may not cause symptoms. Very high blood pressure (hypertensive crisis) may cause: Headache. Fast or irregular heartbeats (palpitations). Shortness of breath. Nosebleed. Nausea and vomiting. Vision changes. Severe chest pain, dizziness, and seizures. How is this diagnosed? This condition is diagnosed by measuring your blood pressure while you are seated, with your arm resting on a flat surface, your legs uncrossed, and your feet flat on the floor. The cuff of the blood pressure monitor will be placed directly against the skin of your upper arm at the level of your heart. Blood pressure should be measured at least twice using the same arm. Certain conditions can cause a difference in blood pressure between your right and left arms. If you have a high blood pressure reading during one visit or you have normal blood pressure with other risk factors, you may be asked to: Return on a different day to have your blood pressure checked again. Monitor your blood pressure at home for 1 week or longer. If you are diagnosed with hypertension, you may have  other blood or imaging tests to help your health care provider understand your overall risk for other conditions. How is this treated? This condition is treated by making healthy lifestyle changes, such as eating healthy foods, exercising more, and reducing your alcohol intake. You may be referred for counseling on a healthy diet and physical activity. Your health care provider may prescribe medicine if lifestyle changes are not enough to get your  blood pressure under control and if: Your systolic blood pressure is above 130. Your diastolic blood pressure is above 80. Your personal target blood pressure may vary depending on your medical conditions, your age, and other factors. Follow these instructions at home: Eating and drinking  Eat a diet that is high in fiber and potassium, and low in sodium, added sugar, and fat. An example of this eating plan is called the DASH diet. DASH stands for Dietary Approaches to Stop Hypertension. To eat this way: Eat plenty of fresh fruits and vegetables. Try to fill one half of your plate at each meal with fruits and vegetables. Eat whole grains, such as whole-wheat pasta, brown rice, or whole-grain bread. Fill about one fourth of your plate with whole grains. Eat or drink low-fat dairy products, such as skim milk or low-fat yogurt. Avoid fatty cuts of meat, processed or cured meats, and poultry with skin. Fill about one fourth of your plate with lean proteins, such as fish, chicken without skin, beans, eggs, or tofu. Avoid pre-made and processed foods. These tend to be higher in sodium, added sugar, and fat. Reduce your daily sodium intake. Many people with hypertension should eat less than 1,500 mg of sodium a day. Do not drink alcohol if: Your health care provider tells you not to drink. You are pregnant, may be pregnant, or are planning to become pregnant. If you drink alcohol: Limit how much you have to: 0-1 drink a day for women. 0-2 drinks a day for  men. Know how much alcohol is in your drink. In the [place] one drink equals one 12 oz bottle of beer (355 mL), one 5 oz glass of wine (148 mL), or one 1 oz glass of hard liquor (44 mL). Lifestyle  Work with your health care provider to maintain a healthy body weight or to lose weight. Ask what an ideal weight is for you. Get at least 30 minutes of exercise that causes your heart to beat faster (aerobic exercise) most days of the week. Activities may include walking, swimming, or biking. Include exercise to strengthen your muscles (resistance exercise), such as Pilates or lifting weights, as part of your weekly exercise routine. Try to do these types of exercises for 30 minutes at least 3 days a week. Do not use any products that contain nicotine or tobacco. These products include cigarettes, chewing tobacco, and vaping devices, such as e-cigarettes. If you need help quitting, ask your health care provider. Monitor your blood pressure at home as told by your health care provider. Keep all follow-up visits. This is important. Medicines Take over-the-counter and prescription medicines only as told by your health care provider. Follow directions carefully. Blood pressure medicines must be taken as prescribed. Do not skip doses of blood pressure medicine. Doing this puts you at risk for problems and can make the medicine less effective. Ask your health care provider about side effects or reactions to medicines that you should watch for. Contact a health care provider if you: Think you are having a reaction to a medicine you are taking. Have headaches that keep coming back (recurring). Feel dizzy. Have swelling in your ankles. Have trouble with your vision. Get help right away if you: Develop a severe headache or confusion. Have unusual weakness or numbness. Feel faint. Have severe pain in your chest or abdomen. Vomit repeatedly. Have trouble breathing. These symptoms may be an emergency. Get  help right away. Call 911. Do not wait to see if the symptoms  will go away. Do not drive yourself to the hospital. Summary Hypertension is when the force of blood pumping through your arteries is too strong. If this condition is not controlled, it may put you at risk for serious complications. Your personal target blood pressure may vary depending on your medical conditions, your age, and other factors. For most people, a normal blood pressure is less than 120/80. Hypertension is treated with lifestyle changes, medicines, or a combination of both. Lifestyle changes include losing weight, eating a healthy, low-sodium diet, exercising more, and limiting alcohol. This information is not intended to replace advice given to you by your health care provider. Make sure you discuss any questions you have with your health care provider. Document Revised: 12/03/2020 Document Reviewed: 12/03/2020 Elsevier Patient Education  2024 Elsevier Inc.     Maryagnes Small, MD [facility]

## 2023-09-09 ENCOUNTER — Other Ambulatory Visit: Payer: Self-pay | Admitting: Emergency Medicine

## 2023-09-09 DIAGNOSIS — I10 Essential (primary) hypertension: Secondary | ICD-10-CM

## 2023-10-26 ENCOUNTER — Other Ambulatory Visit: Payer: Self-pay | Admitting: Emergency Medicine

## 2023-10-26 DIAGNOSIS — I10 Essential (primary) hypertension: Secondary | ICD-10-CM

## 2023-12-27 ENCOUNTER — Encounter: Payer: Self-pay | Admitting: Emergency Medicine

## 2023-12-27 ENCOUNTER — Ambulatory Visit (INDEPENDENT_AMBULATORY_CARE_PROVIDER_SITE_OTHER): Admitting: Emergency Medicine

## 2023-12-27 VITALS — BP 140/92 | HR 67 | Temp 97.6°F | Ht 66.0 in | Wt 222.0 lb

## 2023-12-27 DIAGNOSIS — R7303 Prediabetes: Secondary | ICD-10-CM

## 2023-12-27 DIAGNOSIS — I10 Essential (primary) hypertension: Secondary | ICD-10-CM | POA: Diagnosis not present

## 2023-12-27 DIAGNOSIS — Z6836 Body mass index (BMI) 36.0-36.9, adult: Secondary | ICD-10-CM

## 2023-12-27 LAB — CBC WITH DIFFERENTIAL/PLATELET
Basophils Absolute: 0 K/uL (ref 0.0–0.1)
Basophils Relative: 0.4 % (ref 0.0–3.0)
Eosinophils Absolute: 0.1 K/uL (ref 0.0–0.7)
Eosinophils Relative: 1.4 % (ref 0.0–5.0)
HCT: 39.9 % (ref 36.0–46.0)
Hemoglobin: 13.2 g/dL (ref 12.0–15.0)
Lymphocytes Relative: 28 % (ref 12.0–46.0)
Lymphs Abs: 2.5 K/uL (ref 0.7–4.0)
MCHC: 33.2 g/dL (ref 30.0–36.0)
MCV: 82.8 fl (ref 78.0–100.0)
Monocytes Absolute: 0.6 K/uL (ref 0.1–1.0)
Monocytes Relative: 7 % (ref 3.0–12.0)
Neutro Abs: 5.7 K/uL (ref 1.4–7.7)
Neutrophils Relative %: 63.2 % (ref 43.0–77.0)
Platelets: 272 K/uL (ref 150.0–400.0)
RBC: 4.82 Mil/uL (ref 3.87–5.11)
RDW: 14.3 % (ref 11.5–15.5)
WBC: 9 K/uL (ref 4.0–10.5)

## 2023-12-27 MED ORDER — VALSARTAN-HYDROCHLOROTHIAZIDE 80-12.5 MG PO TABS: 1.0000 | ORAL_TABLET | Freq: Every day | ORAL | 3 refills | Status: AC

## 2023-12-27 NOTE — Assessment & Plan Note (Signed)
 Diet and nutrition discussed.  Advised to decrease amount of daily carbohydrate intake and daily calories and increase amount of plant-based protein in her diet.

## 2023-12-27 NOTE — Patient Instructions (Signed)
 Hypertension, Adult High blood pressure (hypertension) is when the force of blood pumping through the arteries is too strong. The arteries are the blood vessels that carry blood from the heart throughout the body. Hypertension forces the heart to work harder to pump blood and may cause arteries to become narrow or stiff. Untreated or uncontrolled hypertension can lead to a heart attack, heart failure, a stroke, kidney disease, and other problems. A blood pressure reading consists of a higher number over a lower number. Ideally, your blood pressure should be below 120/80. The first ("top") number is called the systolic pressure. It is a measure of the pressure in your arteries as your heart beats. The second ("bottom") number is called the diastolic pressure. It is a measure of the pressure in your arteries as the heart relaxes. What are the causes? The exact cause of this condition is not known. There are some conditions that result in high blood pressure. What increases the risk? Certain factors may make you more likely to develop high blood pressure. Some of these risk factors are under your control, including: Smoking. Not getting enough exercise or physical activity. Being overweight. Having too much fat, sugar, calories, or salt (sodium) in your diet. Drinking too much alcohol. Other risk factors include: Having a personal history of heart disease, diabetes, high cholesterol, or kidney disease. Stress. Having a family history of high blood pressure and high cholesterol. Having obstructive sleep apnea. Age. The risk increases with age. What are the signs or symptoms? High blood pressure may not cause symptoms. Very high blood pressure (hypertensive crisis) may cause: Headache. Fast or irregular heartbeats (palpitations). Shortness of breath. Nosebleed. Nausea and vomiting. Vision changes. Severe chest pain, dizziness, and seizures. How is this diagnosed? This condition is diagnosed by  measuring your blood pressure while you are seated, with your arm resting on a flat surface, your legs uncrossed, and your feet flat on the floor. The cuff of the blood pressure monitor will be placed directly against the skin of your upper arm at the level of your heart. Blood pressure should be measured at least twice using the same arm. Certain conditions can cause a difference in blood pressure between your right and left arms. If you have a high blood pressure reading during one visit or you have normal blood pressure with other risk factors, you may be asked to: Return on a different day to have your blood pressure checked again. Monitor your blood pressure at home for 1 week or longer. If you are diagnosed with hypertension, you may have other blood or imaging tests to help your health care provider understand your overall risk for other conditions. How is this treated? This condition is treated by making healthy lifestyle changes, such as eating healthy foods, exercising more, and reducing your alcohol intake. You may be referred for counseling on a healthy diet and physical activity. Your health care provider may prescribe medicine if lifestyle changes are not enough to get your blood pressure under control and if: Your systolic blood pressure is above 130. Your diastolic blood pressure is above 80. Your personal target blood pressure may vary depending on your medical conditions, your age, and other factors. Follow these instructions at home: Eating and drinking  Eat a diet that is high in fiber and potassium, and low in sodium, added sugar, and fat. An example of this eating plan is called the DASH diet. DASH stands for Dietary Approaches to Stop Hypertension. To eat this way: Eat  plenty of fresh fruits and vegetables. Try to fill one half of your plate at each meal with fruits and vegetables. Eat whole grains, such as whole-wheat pasta, brown rice, or whole-grain bread. Fill about one  fourth of your plate with whole grains. Eat or drink low-fat dairy products, such as skim milk or low-fat yogurt. Avoid fatty cuts of meat, processed or cured meats, and poultry with skin. Fill about one fourth of your plate with lean proteins, such as fish, chicken without skin, beans, eggs, or tofu. Avoid pre-made and processed foods. These tend to be higher in sodium, added sugar, and fat. Reduce your daily sodium intake. Many people with hypertension should eat less than 1,500 mg of sodium a day. Do not drink alcohol if: Your health care provider tells you not to drink. You are pregnant, may be pregnant, or are planning to become pregnant. If you drink alcohol: Limit how much you have to: 0-1 drink a day for women. 0-2 drinks a day for men. Know how much alcohol is in your drink. In the [place] one drink equals one 12 oz bottle of beer (355 mL), one 5 oz glass of wine (148 mL), or one 1 oz glass of hard liquor (44 mL). Lifestyle  Work with your health care provider to maintain a healthy body weight or to lose weight. Ask what an ideal weight is for you. Get at least 30 minutes of exercise that causes your heart to beat faster (aerobic exercise) most days of the week. Activities may include walking, swimming, or biking. Include exercise to strengthen your muscles (resistance exercise), such as Pilates or lifting weights, as part of your weekly exercise routine. Try to do these types of exercises for 30 minutes at least 3 days a week. Do not use any products that contain nicotine or tobacco. These products include cigarettes, chewing tobacco, and vaping devices, such as e-cigarettes. If you need help quitting, ask your health care provider. Monitor your blood pressure at home as told by your health care provider. Keep all follow-up visits. This is important. Medicines Take over-the-counter and prescription medicines only as told by your health care provider. Follow directions carefully. Blood  pressure medicines must be taken as prescribed. Do not skip doses of blood pressure medicine. Doing this puts you at risk for problems and can make the medicine less effective. Ask your health care provider about side effects or reactions to medicines that you should watch for. Contact a health care provider if you: Think you are having a reaction to a medicine you are taking. Have headaches that keep coming back (recurring). Feel dizzy. Have swelling in your ankles. Have trouble with your vision. Get help right away if you: Develop a severe headache or confusion. Have unusual weakness or numbness. Feel faint. Have severe pain in your chest or abdomen. Vomit repeatedly. Have trouble breathing. These symptoms may be an emergency. Get help right away. Call 911. Do not wait to see if the symptoms will go away. Do not drive yourself to the hospital. Summary Hypertension is when the force of blood pumping through your arteries is too strong. If this condition is not controlled, it may put you at risk for serious complications. Your personal target blood pressure may vary depending on your medical conditions, your age, and other factors. For most people, a normal blood pressure is less than 120/80. Hypertension is treated with lifestyle changes, medicines, or a combination of both. Lifestyle changes include losing weight, eating a healthy,  low-sodium diet, exercising more, and limiting alcohol. This information is not intended to replace advice given to you by your health care provider. Make sure you discuss any questions you have with your health care provider. Document Revised: 12/03/2020 Document Reviewed: 12/03/2020 Elsevier Patient Education  2024 [employer].

## 2023-12-27 NOTE — Progress Notes (Signed)
 Carla Morrow 43 y.o.   Chief Complaint  Patient presents with   Medical Management of Chronic Issues    6 month f/u    HISTORY OF PRESENT ILLNESS: This is a 43 y.o. female here for 39-month follow-up of hypertension Blood pressure readings at home still elevated Asymptomatic No other complaints or medical concerns today. BP Readings from Last 3 Encounters:  12/27/23 (!) 140/92  06/23/23 (!) 144/90  03/19/23 (!) 152/98   Wt Readings from Last 3 Encounters:  12/27/23 222 lb (100.7 kg)  06/23/23 227 lb 2 oz (103 kg)  03/19/23 231 lb 3.2 oz (104.9 kg)     HPI   Prior to Admission medications   Medication Sig Start Date End Date Taking? Authorizing Provider  amLODipine  (NORVASC ) 5 MG tablet TAKE 1 TABLET(5 MG) BY MOUTH DAILY 09/09/23  Yes Anaeli Cornwall, [place], MD  hydrochlorothiazide  (HYDRODIURIL ) 12.5 MG tablet TAKE 1 TABLET(12.5 MG) BY MOUTH DAILY. FOLLOW-UP WITH PROVIDER FOR MORE REFILLS. 10/26/23  Yes Wilborn Membreno Jose, MD  norethindrone -ethinyl estradiol  1/35 (NORTREL  1/35, 28,) tablet Take 1 tablet by mouth daily. 03/19/23  Yes Rudy Carlin LABOR, MD    No Known Allergies  Patient Active Problem List   Diagnosis Date Noted   Cervical radiculopathy 06/23/2023   Systemic lupus erythematosus, unspecified (HCC) 02/16/2023   Prediabetes 05/13/2021   Body mass index (BMI) 36.0-36.9, adult 11/07/2020   Essential hypertension 11/07/2020    Past Medical History:  Diagnosis Date   Hypertension    Medical history non-contributory    No pertinent past medical history     Past Surgical History:  Procedure Laterality Date   NO PAST SURGERIES      Social History   Socioeconomic History   Marital status: Significant Other    Spouse name: Not on file   Number of children: Not on file   Years of education: Not on file   Highest education level: Bachelor's degree (e.g., BA, AB, BS)  Occupational History   Not on file  Tobacco Use   Smoking status: Never    Smokeless tobacco: Never  Vaping Use   Vaping status: Never Used  Substance and Sexual Activity   Alcohol use: No    Alcohol/week: 0.0 standard drinks of alcohol   Drug use: No   Sexual activity: Yes    Partners: Male    Birth control/protection: Pill  Other Topics Concern   Not on file  Social History Narrative   ** Merged History Encounter **       ** Merged History Encounter **       Social Drivers of Corporate Investment Banker Strain: Low Risk  (02/16/2023)   Overall Financial Resource Strain (CARDIA)    Difficulty of Paying Living Expenses: Not hard at all  Food Insecurity: No Food Insecurity (02/16/2023)   Hunger Vital Sign    Worried About Running Out of Food in the Last Year: Never true    Ran Out of Food in the Last Year: Never true  Transportation Needs: No Transportation Needs (02/16/2023)   PRAPARE - Administrator, Civil Service (Medical): No    Lack of Transportation (Non-Medical): No  Physical Activity: Insufficiently Active (02/16/2023)   Exercise Vital Sign    Days of Exercise per Week: 3 days    Minutes of Exercise per Session: 20 min  Stress: No Stress Concern Present (02/16/2023)   [employer] of Occupational Health - Occupational Stress Questionnaire    Feeling of Stress :  Only a little  Social Connections: Moderately Integrated (02/16/2023)   Social Connection and Isolation Panel    Frequency of Communication with Friends and Family: More than three times a week    Frequency of Social Gatherings with Friends and Family: More than three times a week    Attends Religious Services: More than 4 times per year    Active Member of [employer] or Organizations: Yes    Attends Engineer, Structural: More than 4 times per year    Marital Status: Divorced  Catering Manager Violence: Not on file    Family History  Problem Relation Age of Onset   Anuerysm Mother    Hypertension Father      Review of Systems  Constitutional: Negative.   Negative for chills and fever.  HENT: Negative.  Negative for congestion and sore throat.   Respiratory: Negative.  Negative for cough and shortness of breath.   Cardiovascular: Negative.  Negative for chest pain and palpitations.  Gastrointestinal:  Negative for abdominal pain, diarrhea, nausea and vomiting.  Genitourinary: Negative.  Negative for dysuria and hematuria.  Skin: Negative.  Negative for rash.  Neurological: Negative.  Negative for dizziness and headaches.  All other systems reviewed and are negative.   Vitals:   12/27/23 1541  BP: (!) 140/92  Pulse: 67  Temp: 97.6 F (36.4 C)  SpO2: 97%    Physical Exam Vitals reviewed.  Constitutional:      Appearance: Normal appearance.  HENT:     Head: Normocephalic.     Mouth/Throat:     Mouth: Mucous membranes are moist.     Pharynx: Oropharynx is clear.  Eyes:     Extraocular Movements: Extraocular movements intact.     Conjunctiva/sclera: Conjunctivae normal.     Pupils: Pupils are equal, round, and reactive to light.  Cardiovascular:     Rate and Rhythm: Normal rate and regular rhythm.     Pulses: Normal pulses.     Heart sounds: Normal heart sounds.  Pulmonary:     Effort: Pulmonary effort is normal.     Breath sounds: Normal breath sounds.  Abdominal:     Palpations: Abdomen is soft.     Tenderness: There is no abdominal tenderness.  Musculoskeletal:     Cervical back: No tenderness.  Lymphadenopathy:     Cervical: No cervical adenopathy.  Skin:    General: Skin is warm and dry.     Capillary Refill: Capillary refill takes less than 2 seconds.  Neurological:     General: No focal deficit present.     Mental Status: She is alert and oriented to person, place, and time.  Psychiatric:        Mood and Affect: Mood normal.        Behavior: Behavior normal.      ASSESSMENT & PLAN: Problem List Items Addressed This Visit       Cardiovascular and Mediastinum   Essential hypertension - Primary   BP  Readings from Last 3 Encounters:  12/27/23 (!) 140/92  06/23/23 (!) 144/90  03/19/23 (!) 152/98  Elevated blood pressures at home in the office Cardiovascular risks associated with hypertension discussed Diet and nutrition discussed Recommend to continue amlodipine  at 5 mg daily Recommend to start valsartan HCT 80-12.5 mg daily Stop hydrochlorothiazide  12.5 mg Recommend blood work today Follow-up in 6 months       Relevant Medications   valsartan-hydrochlorothiazide  (DIOVAN-HCT) 80-12.5 MG tablet   Other Relevant Orders   CBC with  Differential/Platelet   Comprehensive metabolic panel with GFR   Hemoglobin A1c   Lipid panel     Other   Body mass index (BMI) 36.0-36.9, adult   Diet and nutrition discussed Advised to decrease amount of daily carbohydrate intake and daily calories and increase amount of plant-based protein in her diet      Prediabetes   Chronic stable condition Cardiovascular risks associated with diabetes discussed Diet and nutrition discussed Advised to increase amount of daily carbohydrate intake and daily calories and increase amount of plant-based protein diet Benefits of exercise discussed      Relevant Orders   CBC with Differential/Platelet   Comprehensive metabolic panel with GFR   Hemoglobin A1c   Lipid panel   Patient Instructions  Hypertension, Adult High blood pressure (hypertension) is when the force of blood pumping through the arteries is too strong. The arteries are the blood vessels that carry blood from the heart throughout the body. Hypertension forces the heart to work harder to pump blood and may cause arteries to become narrow or stiff. Untreated or uncontrolled hypertension can lead to a heart attack, heart failure, a stroke, kidney disease, and other problems. A blood pressure reading consists of a higher number over a lower number. Ideally, your blood pressure should be below 120/80. The first (top) number is called the systolic  pressure. It is a measure of the pressure in your arteries as your heart beats. The second (bottom) number is called the diastolic pressure. It is a measure of the pressure in your arteries as the heart relaxes. What are the causes? The exact cause of this condition is not known. There are some conditions that result in high blood pressure. What increases the risk? Certain factors may make you more likely to develop high blood pressure. Some of these risk factors are under your control, including: Smoking. Not getting enough exercise or physical activity. Being overweight. Having too much fat, sugar, calories, or salt (sodium) in your diet. Drinking too much alcohol. Other risk factors include: Having a personal history of heart disease, diabetes, high cholesterol, or kidney disease. Stress. Having a family history of high blood pressure and high cholesterol. Having obstructive sleep apnea. Age. The risk increases with age. What are the signs or symptoms? High blood pressure may not cause symptoms. Very high blood pressure (hypertensive crisis) may cause: Headache. Fast or irregular heartbeats (palpitations). Shortness of breath. Nosebleed. Nausea and vomiting. Vision changes. Severe chest pain, dizziness, and seizures. How is this diagnosed? This condition is diagnosed by measuring your blood pressure while you are seated, with your arm resting on a flat surface, your legs uncrossed, and your feet flat on the floor. The cuff of the blood pressure monitor will be placed directly against the skin of your upper arm at the level of your heart. Blood pressure should be measured at least twice using the same arm. Certain conditions can cause a difference in blood pressure between your right and left arms. If you have a high blood pressure reading during one visit or you have normal blood pressure with other risk factors, you may be asked to: Return on a different day to have your blood  pressure checked again. Monitor your blood pressure at home for 1 week or longer. If you are diagnosed with hypertension, you may have other blood or imaging tests to help your health care provider understand your overall risk for other conditions. How is this treated? This condition is treated by making healthy  lifestyle changes, such as eating healthy foods, exercising more, and reducing your alcohol intake. You may be referred for counseling on a healthy diet and physical activity. Your health care provider may prescribe medicine if lifestyle changes are not enough to get your blood pressure under control and if: Your systolic blood pressure is above 130. Your diastolic blood pressure is above 80. Your personal target blood pressure may vary depending on your medical conditions, your age, and other factors. Follow these instructions at home: Eating and drinking  Eat a diet that is high in fiber and potassium, and low in sodium, added sugar, and fat. An example of this eating plan is called the DASH diet. DASH stands for Dietary Approaches to Stop Hypertension. To eat this way: Eat plenty of fresh fruits and vegetables. Try to fill one half of your plate at each meal with fruits and vegetables. Eat whole grains, such as whole-wheat pasta, brown rice, or whole-grain bread. Fill about one fourth of your plate with whole grains. Eat or drink low-fat dairy products, such as skim milk or low-fat yogurt. Avoid fatty cuts of meat, processed or cured meats, and poultry with skin. Fill about one fourth of your plate with lean proteins, such as fish, chicken without skin, beans, eggs, or tofu. Avoid pre-made and processed foods. These tend to be higher in sodium, added sugar, and fat. Reduce your daily sodium intake. Many people with hypertension should eat less than 1,500 mg of sodium a day. Do not drink alcohol if: Your health care provider tells you not to drink. You are pregnant, may be pregnant, or  are planning to become pregnant. If you drink alcohol: Limit how much you have to: 0-1 drink a day for women. 0-2 drinks a day for men. Know how much alcohol is in your drink. In the [place] one drink equals one 12 oz bottle of beer (355 mL), one 5 oz glass of wine (148 mL), or one 1 oz glass of hard liquor (44 mL). Lifestyle  Work with your health care provider to maintain a healthy body weight or to lose weight. Ask what an ideal weight is for you. Get at least 30 minutes of exercise that causes your heart to beat faster (aerobic exercise) most days of the week. Activities may include walking, swimming, or biking. Include exercise to strengthen your muscles (resistance exercise), such as Pilates or lifting weights, as part of your weekly exercise routine. Try to do these types of exercises for 30 minutes at least 3 days a week. Do not use any products that contain nicotine or tobacco. These products include cigarettes, chewing tobacco, and vaping devices, such as e-cigarettes. If you need help quitting, ask your health care provider. Monitor your blood pressure at home as told by your health care provider. Keep all follow-up visits. This is important. Medicines Take over-the-counter and prescription medicines only as told by your health care provider. Follow directions carefully. Blood pressure medicines must be taken as prescribed. Do not skip doses of blood pressure medicine. Doing this puts you at risk for problems and can make the medicine less effective. Ask your health care provider about side effects or reactions to medicines that you should watch for. Contact a health care provider if you: Think you are having a reaction to a medicine you are taking. Have headaches that keep coming back (recurring). Feel dizzy. Have swelling in your ankles. Have trouble with your vision. Get help right away if you: Develop a severe headache  or confusion. Have unusual weakness or numbness. Feel  faint. Have severe pain in your chest or abdomen. Vomit repeatedly. Have trouble breathing. These symptoms may be an emergency. Get help right away. Call 911. Do not wait to see if the symptoms will go away. Do not drive yourself to the hospital. Summary Hypertension is when the force of blood pumping through your arteries is too strong. If this condition is not controlled, it may put you at risk for serious complications. Your personal target blood pressure may vary depending on your medical conditions, your age, and other factors. For most people, a normal blood pressure is less than 120/80. Hypertension is treated with lifestyle changes, medicines, or a combination of both. Lifestyle changes include losing weight, eating a healthy, low-sodium diet, exercising more, and limiting alcohol. This information is not intended to replace advice given to you by your health care provider. Make sure you discuss any questions you have with your health care provider. Document Revised: 12/03/2020 Document Reviewed: 12/03/2020 Elsevier Patient Education  2024 Elsevier Inc.     Emil Schaumann, MD [facility]

## 2023-12-27 NOTE — Assessment & Plan Note (Signed)
 BP Readings from Last 3 Encounters:  12/27/23 (!) 140/92  06/23/23 (!) 144/90  03/19/23 (!) 152/98  Elevated blood pressures at home in the office Cardiovascular risks associated with hypertension discussed Diet and nutrition discussed Recommend to continue amlodipine  at 5 mg daily Recommend to start valsartan HCT 80-12.5 mg daily Stop hydrochlorothiazide  12.5 mg Recommend blood work today Follow-up in 6 months

## 2023-12-27 NOTE — Assessment & Plan Note (Signed)
 Chronic stable condition Cardiovascular risks associated with diabetes discussed Diet and nutrition discussed Advised to increase amount of daily carbohydrate intake and daily calories and increase amount of plant-based protein diet Benefits of exercise discussed

## 2023-12-28 ENCOUNTER — Ambulatory Visit: Payer: Self-pay | Admitting: Emergency Medicine

## 2023-12-28 LAB — COMPREHENSIVE METABOLIC PANEL WITH GFR
ALT: 16 U/L (ref 0–35)
AST: 15 U/L (ref 0–37)
Albumin: 4.1 g/dL (ref 3.5–5.2)
Alkaline Phosphatase: 68 U/L (ref 39–117)
BUN: 9 mg/dL (ref 6–23)
CO2: 23 meq/L (ref 19–32)
Calcium: 9 mg/dL (ref 8.4–10.5)
Chloride: 101 meq/L (ref 96–112)
Creatinine, Ser: 0.78 mg/dL (ref 0.40–1.20)
GFR: 94.51 mL/min (ref >60.00)
Glucose, Bld: 114 mg/dL — ABNORMAL HIGH (ref 70–99)
Potassium: 3.4 meq/L — ABNORMAL LOW (ref 3.5–5.1)
Sodium: 137 meq/L (ref 135–145)
Total Bilirubin: 0.3 mg/dL (ref 0.2–1.2)
Total Protein: 7.3 g/dL (ref 6.0–8.3)

## 2023-12-28 LAB — LIPID PANEL
Cholesterol: 178 mg/dL (ref 0–200)
HDL: 64.1 mg/dL (ref >39.00)
LDL Cholesterol: 77 mg/dL (ref 0–99)
NonHDL: 114.35
Total CHOL/HDL Ratio: 3
Triglycerides: 189 mg/dL — ABNORMAL HIGH (ref 0.0–149.0)
VLDL: 37.8 mg/dL (ref 0.0–40.0)

## 2023-12-28 LAB — HEMOGLOBIN A1C: Hgb A1c MFr Bld: 6.2 % (ref 4.6–6.5)

## 2024-02-28 ENCOUNTER — Other Ambulatory Visit: Payer: Self-pay

## 2024-02-28 DIAGNOSIS — Z01419 Encounter for gynecological examination (general) (routine) without abnormal findings: Secondary | ICD-10-CM

## 2024-02-28 DIAGNOSIS — Z3041 Encounter for surveillance of contraceptive pills: Secondary | ICD-10-CM

## 2024-02-28 MED ORDER — NORTREL 1/35 (28) 1-35 MG-MCG PO TABS: 1.0000 | ORAL_TABLET | Freq: Every day | ORAL | 1 refills | Status: AC

## 2024-02-28 NOTE — Progress Notes (Signed)
 Patient given birth control refill and patient note sent to schedule annual exam for further refills (annual due 03/2024)

## 2024-04-03 ENCOUNTER — Ambulatory Visit: Admitting: Obstetrics
